# Patient Record
Sex: Female | Born: 1955
Health system: Southern US, Community
[De-identification: ages and names within clinical notes are randomized; demographics above are authoritative.]

## PROBLEM LIST (undated history)

## (undated) DIAGNOSIS — E079 Disorder of thyroid, unspecified: Secondary | ICD-10-CM

## (undated) HISTORY — PX: NASAL SINUS SURGERY: SHX719

## (undated) HISTORY — PX: BACK SURGERY: SHX140

---

## 1998-07-02 ENCOUNTER — Ambulatory Visit (HOSPITAL_BASED_OUTPATIENT_CLINIC_OR_DEPARTMENT_OTHER): Admission: RE | Admit: 1998-07-02 | Discharge: 1998-07-02 | Payer: Self-pay | Admitting: *Deleted

## 2018-06-09 DIAGNOSIS — L570 Actinic keratosis: Secondary | ICD-10-CM | POA: Diagnosis not present

## 2018-07-23 DIAGNOSIS — J0140 Acute pansinusitis, unspecified: Secondary | ICD-10-CM | POA: Diagnosis not present

## 2018-07-24 DIAGNOSIS — Z03818 Encounter for observation for suspected exposure to other biological agents ruled out: Secondary | ICD-10-CM | POA: Diagnosis not present

## 2018-07-24 DIAGNOSIS — Z1159 Encounter for screening for other viral diseases: Secondary | ICD-10-CM | POA: Diagnosis not present

## 2018-07-25 ENCOUNTER — Encounter (HOSPITAL_BASED_OUTPATIENT_CLINIC_OR_DEPARTMENT_OTHER): Payer: Self-pay | Admitting: *Deleted

## 2018-07-25 ENCOUNTER — Emergency Department (HOSPITAL_BASED_OUTPATIENT_CLINIC_OR_DEPARTMENT_OTHER)
Admission: EM | Admit: 2018-07-25 | Discharge: 2018-07-26 | Disposition: A | Discharge status: Home / Self Care | Payer: BC Managed Care – PPO | Attending: Emergency Medicine | Admitting: Emergency Medicine

## 2018-07-25 ENCOUNTER — Other Ambulatory Visit: Payer: Self-pay

## 2018-07-25 DIAGNOSIS — M549 Dorsalgia, unspecified: Secondary | ICD-10-CM | POA: Insufficient documentation

## 2018-07-25 DIAGNOSIS — K449 Diaphragmatic hernia without obstruction or gangrene: Secondary | ICD-10-CM | POA: Diagnosis not present

## 2018-07-25 DIAGNOSIS — R079 Chest pain, unspecified: Secondary | ICD-10-CM | POA: Diagnosis not present

## 2018-07-25 HISTORY — DX: Disorder of thyroid, unspecified: E07.9

## 2018-07-25 NOTE — ED Triage Notes (Addendum)
Pt reports left side posterior back/rib pain after starting amoxicillin for a sinus infection on Friday and states the front of her neck has been painful. She was tested for covid yesterday as a precaution but has not received her results. She states she came in tonight because she felt so bad

## 2018-07-26 ENCOUNTER — Emergency Department (HOSPITAL_BASED_OUTPATIENT_CLINIC_OR_DEPARTMENT_OTHER): Payer: BC Managed Care – PPO

## 2018-07-26 ENCOUNTER — Encounter: Payer: Self-pay | Admitting: Emergency Medicine

## 2018-07-26 DIAGNOSIS — K219 Gastro-esophageal reflux disease without esophagitis: Secondary | ICD-10-CM | POA: Diagnosis not present

## 2018-07-26 DIAGNOSIS — R079 Chest pain, unspecified: Secondary | ICD-10-CM | POA: Diagnosis not present

## 2018-07-26 DIAGNOSIS — Z09 Encounter for follow-up examination after completed treatment for conditions other than malignant neoplasm: Secondary | ICD-10-CM | POA: Diagnosis not present

## 2018-07-26 DIAGNOSIS — K449 Diaphragmatic hernia without obstruction or gangrene: Secondary | ICD-10-CM | POA: Diagnosis not present

## 2018-07-26 LAB — CBC WITH DIFFERENTIAL/PLATELET
Abs Immature Granulocytes: 0.04 10*3/uL (ref 0.00–0.07)
Basophils Absolute: 0.1 10*3/uL (ref 0.0–0.1)
Basophils Relative: 1 %
Eosinophils Absolute: 0.5 10*3/uL (ref 0.0–0.5)
Eosinophils Relative: 4 %
HCT: 40.8 % (ref 36.0–46.0)
Hemoglobin: 13.3 g/dL (ref 12.0–15.0)
Immature Granulocytes: 0 %
Lymphocytes Relative: 22 %
Lymphs Abs: 2.7 10*3/uL (ref 0.7–4.0)
MCH: 29.6 pg (ref 26.0–34.0)
MCHC: 32.6 g/dL (ref 30.0–36.0)
MCV: 90.7 fL (ref 80.0–100.0)
Monocytes Absolute: 1.1 10*3/uL — ABNORMAL HIGH (ref 0.1–1.0)
Monocytes Relative: 9 %
Neutro Abs: 7.6 10*3/uL (ref 1.7–7.7)
Neutrophils Relative %: 64 %
Platelets: 277 10*3/uL (ref 150–400)
RBC: 4.5 MIL/uL (ref 3.87–5.11)
RDW: 13.3 % (ref 11.5–15.5)
WBC: 12 10*3/uL — ABNORMAL HIGH (ref 4.0–10.5)
nRBC: 0 % (ref 0.0–0.2)

## 2018-07-26 LAB — TROPONIN I (HIGH SENSITIVITY)
Troponin I (High Sensitivity): <2 ng/L (ref <18)
Troponin I (High Sensitivity): <2 ng/L (ref <18)

## 2018-07-26 LAB — COMPREHENSIVE METABOLIC PANEL
ALT: 26 U/L (ref 0–44)
AST: 23 U/L (ref 15–41)
Albumin: 3.7 g/dL (ref 3.5–5.0)
Alkaline Phosphatase: 68 U/L (ref 38–126)
Anion gap: 9 (ref 5–15)
BUN: 13 mg/dL (ref 8–23)
CO2: 25 mmol/L (ref 22–32)
Calcium: 8.9 mg/dL (ref 8.9–10.3)
Chloride: 105 mmol/L (ref 98–111)
Creatinine, Ser: 0.7 mg/dL (ref 0.44–1.00)
GFR calc Af Amer: >60 mL/min (ref >60)
GFR calc non Af Amer: >60 mL/min (ref >60)
Glucose, Bld: 108 mg/dL — ABNORMAL HIGH (ref 70–99)
Potassium: 3.7 mmol/L (ref 3.5–5.1)
Sodium: 139 mmol/L (ref 135–145)
Total Bilirubin: 0.5 mg/dL (ref 0.3–1.2)
Total Protein: 6.1 g/dL — ABNORMAL LOW (ref 6.5–8.1)

## 2018-07-26 LAB — LIPASE, BLOOD: Lipase: 40 U/L (ref 11–51)

## 2018-07-26 MED ORDER — ALUM & MAG HYDROXIDE-SIMETH 200-200-20 MG/5ML PO SUSP
30.0000 mL | Freq: Once | ORAL | Status: AC
  Administered 2018-07-26: 02:00:00 30 mL via ORAL
  Filled 2018-07-26: qty 30

## 2018-07-26 MED ORDER — IOHEXOL 350 MG/ML SOLN
100.0000 mL | Freq: Once | INTRAVENOUS | Status: AC | PRN
  Administered 2018-07-26: 100 mL via INTRAVENOUS

## 2018-07-26 MED ORDER — LIDOCAINE VISCOUS HCL 2 % MT SOLN
15.0000 mL | Freq: Once | OROMUCOSAL | Status: AC
  Administered 2018-07-26: 15 mL via ORAL
  Filled 2018-07-26: qty 15

## 2018-07-26 MED ORDER — SODIUM CHLORIDE 0.9 % IV BOLUS
500.0000 mL | Freq: Once | INTRAVENOUS | Status: AC
  Administered 2018-07-26: 03:00:00 500 mL via INTRAVENOUS

## 2018-07-26 MED ORDER — ASPIRIN 81 MG PO CHEW
324.0000 mg | CHEWABLE_TABLET | Freq: Once | ORAL | Status: AC
  Administered 2018-07-26: 324 mg via ORAL
  Filled 2018-07-26: qty 4

## 2018-07-26 MED ORDER — PANTOPRAZOLE SODIUM 20 MG PO TBEC: 20.0000 mg | DELAYED_RELEASE_TABLET | Freq: Two times a day (BID) | ORAL | 3 refills | Status: AC

## 2018-07-26 MED ORDER — NITROGLYCERIN 0.4 MG SL SUBL
0.4000 mg | SUBLINGUAL_TABLET | SUBLINGUAL | Status: DC | PRN
  Administered 2018-07-26 (×3): 0.4 mg via SUBLINGUAL
  Filled 2018-07-26: qty 1

## 2018-07-26 NOTE — ED Provider Notes (Signed)
[facility] EMERGENCY DEPARTMENT Provider Note   CSN: [id number] Arrival date & time: 07/25/18  2348    History   Chief Complaint Chief Complaint  Patient presents with   Back Pain   Neck Pain    HPI Mary Orr is a 63 y.o. female.  HPI: A 63 year old patient presents for evaluation of chest pain. Initial onset of pain was approximately 3-6 hours ago. The patient's chest pain is described as heaviness/pressure/tightness and is not worse with exertion. The patient's chest pain is middle- or left-sided, is not well-localized, is not sharp and does radiate to the arms/jaw/neck. The patient does not complain of nausea and denies diaphoresis. The patient has no history of stroke, has no history of peripheral artery disease, has not smoked in the past 90 days, denies any history of treated diabetes, has no relevant family history of coronary artery disease (first degree relative at less than age 61), is not hypertensive, has no history of hypercholesterolemia and does not have an elevated BMI (>=30).   Patient presents to the emergency department for evaluation of chest and back discomfort.  Patient reports that she has not been feeling well for several days.  She talked to her primary doctor 2 days ago when she noticed that she was having some sinus congestion and was started on amoxicillin.  This evening she started to feel discomfort in the center of her back and feeling like she might be experiencing indigestion.  She took an antacid without improvement.  Over the last couple of hours, symptoms have worsened.  She is experiencing pain in the center of her chest that radiates up into the jaw.     Past Medical History:  Diagnosis Date   Thyroid disease     There are no active problems to display for this patient.   Past Surgical History:  Procedure Laterality Date   BACK SURGERY     NASAL SINUS SURGERY       OB History   No obstetric history on file.       Home Medications    Prior to Admission medications   Medication Sig Start Date End Date Taking? Authorizing Provider  amoxicillin-clavulanate (AUGMENTIN) 875-125 MG tablet Take 1 tablet by mouth 2 (two) times daily.   Yes [provider]  levothyroxine (SYNTHROID) 75 MCG tablet Take 75 mcg by mouth daily before breakfast.   Yes [provider]  pantoprazole (PROTONIX) 20 MG tablet Take 1 tablet (20 mg total) by mouth 2 (two) times daily before a meal. 07/26/18   Pier Bosher, Canary Brimhristopher J, MD    Family History No family history on file.  Social History Social History   Tobacco Use   Smoking status: Never Smoker   Smokeless tobacco: Never Used  Substance Use Topics   Alcohol use: Never    Frequency: Never   Drug use: Never     Allergies   Patient has no known allergies.   Review of Systems Review of Systems  Cardiovascular: Positive for chest pain.  Gastrointestinal: Positive for abdominal distention (Feels bloated) and blood in stool.  Musculoskeletal: Positive for back pain.  All other systems reviewed and are negative.    Physical Exam Updated Vital Signs BP 118/74    Pulse 83    Temp 98.4 F (36.9 C)    Resp 10    Ht 5\' 3"  (1.6 m)    Wt 77.1 kg    SpO2 95%    BMI 30.11 kg/m  Physical Exam Vitals signs and nursing note reviewed.  Constitutional:      General: She is not in acute distress.    Appearance: Normal appearance. She is well-developed.  HENT:     Head: Normocephalic and atraumatic.     Right Ear: Hearing normal.     Left Ear: Hearing normal.     Nose: Nose normal.  Eyes:     Conjunctiva/sclera: Conjunctivae normal.     Pupils: Pupils are equal, round, and reactive to light.  Neck:     Musculoskeletal: Normal range of motion and neck supple.  Cardiovascular:     Rate and Rhythm: Regular rhythm.     Heart sounds: S1 normal and S2 normal. No murmur. No friction rub. No gallop.   Pulmonary:     Effort: Pulmonary effort is  normal. No respiratory distress.     Breath sounds: Normal breath sounds.  Chest:     Chest wall: No tenderness.  Abdominal:     General: Bowel sounds are normal.     Palpations: Abdomen is soft.     Tenderness: There is no abdominal tenderness. There is no guarding or rebound. Negative signs include Murphy's sign and McBurney's sign.     Hernia: No hernia is present.  Musculoskeletal: Normal range of motion.  Skin:    General: Skin is warm and dry.     Findings: No rash.  Neurological:     Mental Status: She is alert and oriented to person, place, and time.     GCS: GCS eye subscore is 4. GCS verbal subscore is 5. GCS motor subscore is 6.     Cranial Nerves: No cranial nerve deficit.     Sensory: No sensory deficit.     Coordination: Coordination normal.  Psychiatric:        Speech: Speech normal.        Behavior: Behavior normal.        Thought Content: Thought content normal.      ED Treatments / Results  Labs (all labs ordered are listed, but only abnormal results are displayed) Labs Reviewed  CBC WITH DIFFERENTIAL/PLATELET - Abnormal; Notable for the following components:      Result Value   WBC 12.0 (*)    Monocytes Absolute 1.1 (*)    All other components within normal limits  COMPREHENSIVE METABOLIC PANEL - Abnormal; Notable for the following components:   Glucose, Bld 108 (*)    Total Protein 6.1 (*)    All other components within normal limits  LIPASE, BLOOD  TROPONIN I (HIGH SENSITIVITY)  TROPONIN I (HIGH SENSITIVITY)    EKG EKG Interpretation  Date/Time:  Monday July 26 2018 00:36:46 EDT Ventricular Rate:  70 PR Interval:    QRS Duration: 85 QT Interval:  404 QTC Calculation: 436 R Axis:   23 Text Interpretation:  Sinus rhythm Low voltage, precordial leads Abnormal R-wave progression, early transition Baseline wander in lead(s) V6 No previous tracing Confirmed by Orpah Greek [phone]) on 07/26/2018 12:39:28 AM   Radiology Dg Chest 2  View  Result Date: 07/26/2018 CLINICAL DATA:  Neck pain, left chest pain. EXAM: CHEST - 2 VIEW COMPARISON:  None. FINDINGS: Heart and mediastinal contours are within normal limits. No focal opacities or effusions. No acute bony abnormality. IMPRESSION: No active cardiopulmonary disease. Electronically Signed   By: Rolm Baptise M.D.   On: 07/26/2018 01:34   Ct Angio Chest/abd/pel For Dissection W &/or Wo Contrast  Result Date: 07/26/2018 CLINICAL DATA:  Chest and back pain EXAM: CT ANGIOGRAPHY CHEST, ABDOMEN AND PELVIS TECHNIQUE: Multidetector CT imaging through the chest, abdomen and pelvis was performed using the standard protocol during bolus administration of intravenous contrast. Multiplanar reconstructed images and MIPs were obtained and reviewed to evaluate the vascular anatomy. CONTRAST:  100mL OMNIPAQUE IOHEXOL 350 MG/ML SOLN COMPARISON:  07/26/2018 chest x-ray FINDINGS: CTA CHEST FINDINGS Cardiovascular: Heart is normal size. Aorta is normal caliber. No evidence of aortic dissection. No filling defects in the pulmonary arteries to suggest pulmonary emboli. Mediastinum/Nodes: No mediastinal, hilar, or axillary adenopathy. Trachea and esophagus are unremarkable. Small hiatal hernia. Thyroid unremarkable. Lungs/Pleura: Lungs are clear. No focal airspace opacities or suspicious nodules. No effusions. Musculoskeletal: No acute bony abnormality. Review of the MIP images confirms the above findings. CTA ABDOMEN AND PELVIS FINDINGS VASCULAR Aorta: Normal caliber aorta without aneurysm, dissection, vasculitis or significant stenosis. Celiac: Patent without evidence of aneurysm, dissection, vasculitis or significant stenosis. SMA: Patent without evidence of aneurysm, dissection, vasculitis or significant stenosis. Renals: Single right renal artery and 2 left renal arteries. No aneurysm. Widely patent. IMA: Patent without evidence of aneurysm, dissection, vasculitis or significant stenosis. Inflow: Patent  without evidence of aneurysm, dissection, vasculitis or significant stenosis. Veins: No obvious venous abnormality within the limitations of this arterial phase study. Review of the MIP images confirms the above findings. NON-VASCULAR Hepatobiliary: No focal liver abnormality is seen. Status post cholecystectomy. No biliary dilatation. Pancreas: No focal abnormality or ductal dilatation. Spleen: No focal abnormality.  Normal size. Adrenals/Urinary Tract: No adrenal abnormality. No focal renal abnormality. No stones or hydronephrosis. Urinary bladder is unremarkable. Stomach/Bowel: Normal appendix. Stomach, large and small bowel grossly unremarkable. Lymphatic: No adenopathy Reproductive: Uterus and adnexa unremarkable.  No mass. Other: No free fluid or free air. Musculoskeletal: No acute bony abnormality. Review of the MIP images confirms the above findings. IMPRESSION: No evidence of aortic aneurysm or dissection. No acute cardiopulmonary disease. Small hiatal hernia. No acute finding in the abdomen or pelvis. Electronically Signed   By: Charlett NoseKevin  Dover M.D.   On: 07/26/2018 03:13    Procedures Procedures (including critical care time)  Medications Ordered in ED Medications  nitroGLYCERIN (NITROSTAT) SL tablet 0.4 mg (0.4 mg Sublingual Given 07/26/18 0106)  aspirin chewable tablet 324 mg (324 mg Oral Given 07/26/18 0050)  alum & mag hydroxide-simeth (MAALOX/MYLANTA) 200-200-20 MG/5ML suspension 30 mL (30 mLs Oral Given 07/26/18 0205)    And  lidocaine (XYLOCAINE) 2 % viscous mouth solution 15 mL (15 mLs Oral Given 07/26/18 0205)  sodium chloride 0.9 % bolus 500 mL (500 mLs Intravenous New Bag/Given 07/26/18 0255)  iohexol (OMNIPAQUE) 350 MG/ML injection 100 mL (100 mLs Intravenous Contrast Given 07/26/18 0244)     Initial Impression / Assessment and Plan / ED Course  I have reviewed the triage vital signs and the nursing notes.  Pertinent labs & imaging results that were available during my care of the  patient were reviewed by me and considered in my medical decision making (see chart for details).     HEAR Score: 2  Patient presents to the emergency department for evaluation of chest and back discomfort.  Patient reports that she is experiencing mostly pain between her shoulder blades but that she has a sensation of a lump in her throat as well.  She reports that this feels more pronounced when she breathes in.  She has not feeling short of breath.  She had some discomfort in the upper chest intermittently today as well.  Additionally, patient has felt bloated and has some mild discomfort in her abdomen.  She had some loose stools earlier and there was some blood in the last time she had a bowel movement.  Patient has very low cardiac risk.  Heart score is 2.  Based on the cardiac pathway, she is felt to be low risk for cardiac disease and discharge at this time could be considered.  Based on her radiation pattern up to the throat, however, I have opted to perform a second troponin.  This second troponin was also negative (<2).  Additionally with her symptoms including chest, back and abdominal complaints, she underwent CT angiography to rule out aortic dissection.  No abnormalities were noted.  Based on this extensive work-up, it is felt the patient can be safely discharged, follow-up with her PCP for recheck.  Return for any worsening symptoms.  Final Clinical Impressions(s) / ED Diagnoses   Final diagnoses:  Chest pain, unspecified type    ED Discharge Orders         Ordered    pantoprazole (PROTONIX) 20 MG tablet  2 times daily before meals     07/26/18 0334           Gilda CreasePollina, Houa Ackert J, MD 07/26/18 [phone]

## 2018-07-26 NOTE — ED Notes (Signed)
Pt reports feeling intermittent chest/back/jaw pain- pt did not take dose of Augmentin today in hopes her symptoms would improve. Pt dx with sinus infection via televisit with pcp

## 2018-07-26 NOTE — ED Notes (Signed)
ED Provider at bedside. 

## 2018-07-26 NOTE — ED Notes (Signed)
Updated patients family regarding plan of care at this time

## 2018-07-26 NOTE — ED Notes (Signed)
MD at bedside, discussing plan of care with patient at this time.

## 2018-07-26 NOTE — ED Notes (Signed)
Pt to CT at this time.

## 2018-09-10 ENCOUNTER — Emergency Department (HOSPITAL_BASED_OUTPATIENT_CLINIC_OR_DEPARTMENT_OTHER): Payer: BC Managed Care – PPO

## 2018-09-10 ENCOUNTER — Encounter (HOSPITAL_BASED_OUTPATIENT_CLINIC_OR_DEPARTMENT_OTHER): Payer: Self-pay

## 2018-09-10 ENCOUNTER — Other Ambulatory Visit: Payer: Self-pay

## 2018-09-10 ENCOUNTER — Emergency Department (HOSPITAL_BASED_OUTPATIENT_CLINIC_OR_DEPARTMENT_OTHER)
Admission: EM | Admit: 2018-09-10 | Discharge: 2018-09-10 | Disposition: A | Discharge status: Home / Self Care | Payer: BC Managed Care – PPO | Attending: Emergency Medicine | Admitting: Emergency Medicine

## 2018-09-10 DIAGNOSIS — R0981 Nasal congestion: Secondary | ICD-10-CM | POA: Diagnosis not present

## 2018-09-10 DIAGNOSIS — R6889 Other general symptoms and signs: Secondary | ICD-10-CM

## 2018-09-10 DIAGNOSIS — R070 Pain in throat: Secondary | ICD-10-CM | POA: Insufficient documentation

## 2018-09-10 DIAGNOSIS — R0989 Other specified symptoms and signs involving the circulatory and respiratory systems: Secondary | ICD-10-CM | POA: Diagnosis not present

## 2018-09-10 DIAGNOSIS — Z20828 Contact with and (suspected) exposure to other viral communicable diseases: Secondary | ICD-10-CM | POA: Diagnosis not present

## 2018-09-10 DIAGNOSIS — J029 Acute pharyngitis, unspecified: Secondary | ICD-10-CM | POA: Diagnosis not present

## 2018-09-10 DIAGNOSIS — E039 Hypothyroidism, unspecified: Secondary | ICD-10-CM | POA: Diagnosis not present

## 2018-09-10 MED ORDER — EPINEPHRINE 0.3 MG/0.3ML IJ SOAJ: 0.3000 mg | INTRAMUSCULAR | 0 refills | Status: AC | PRN

## 2018-09-10 MED ORDER — FAMOTIDINE 20 MG PO TABS
20.0000 mg | ORAL_TABLET | Freq: Once | ORAL | Status: AC
  Administered 2018-09-10: 20 mg via ORAL
  Filled 2018-09-10: qty 1

## 2018-09-10 MED ORDER — PREDNISONE 50 MG PO TABS
60.0000 mg | ORAL_TABLET | Freq: Once | ORAL | Status: AC
  Administered 2018-09-10: 60 mg via ORAL
  Filled 2018-09-10: qty 1

## 2018-09-10 MED ORDER — PREDNISONE 20 MG PO TABS: 40.0000 mg | ORAL_TABLET | Freq: Every day | ORAL | 0 refills | Status: AC

## 2018-09-10 MED FILL — EPINEPHRINE 0.3 MG AUTO-INJ: 0.3 | 2 days supply | Qty: 2 | Fill #0

## 2018-09-10 MED FILL — predniSONE 20 MG TABS: 20 | 4 days supply | Qty: 8 | Fill #0

## 2018-09-10 NOTE — ED Provider Notes (Signed)
Emergency Department Provider Note   I have reviewed the triage vital signs and the nursing notes.   HISTORY  Chief Complaint Allergic Reaction   HPI Mary Orr is a 63 y.o. female presents to the emergency department for evaluation of "thick" feeling in the tongue and back of her throat.  Symptoms began yesterday and worsened through the night.  Patient has no trouble breathing in but occasionally of breathing out feels somewhat uncomfortable.  She also has difficulty swallowing.  Denies any choking or coughing.  She felt initially like she may have been getting an upper respiratory type infection.  She denies fevers.  She has had some mild nasal congestion.  She had a similar episode 1 to 2 weeks ago which she thought was related to exposure to nuts.  She is not on blood pressure medication.  During that episode she did have some upper and lower lip swelling that resolved spontaneously.  No prior history of anaphylaxis.  Denies any itching or rash.  No abdominal or GI symptoms.  No chest pain.   Past Medical History:  Diagnosis Date  . Thyroid disease     There are no active problems to display for this patient.   Past Surgical History:  Procedure Laterality Date  . BACK SURGERY    . NASAL SINUS SURGERY      Allergies Patient has no known allergies.  History reviewed. No pertinent family history.  Social History Social History   Tobacco Use  . Smoking status: Never Smoker  . Smokeless tobacco: Never Used  Substance Use Topics  . Alcohol use: Never    Frequency: Never  . Drug use: Never    Review of Systems  Constitutional: No fever/chills Eyes: No visual changes. ENT: No sore throat. Positive thick feeling in the throat.  Cardiovascular: Denies chest pain. Respiratory: Denies shortness of breath. Gastrointestinal: No abdominal pain.  No nausea, no vomiting.  No diarrhea.  No constipation. Genitourinary: Negative for dysuria. Musculoskeletal: Negative  for back pain. Skin: Negative for rash. Neurological: Negative for headaches, focal weakness or numbness.  10-point ROS otherwise negative.  ____________________________________________   PHYSICAL EXAM:  VITAL SIGNS: ED Triage Vitals  Enc Vitals Group     BP 09/10/18 0919 124/77     Pulse Rate 09/10/18 0919 78     Resp 09/10/18 0919 16     Temp 09/10/18 0919 98.3 F (36.8 C)     Temp Source 09/10/18 0919 Oral     SpO2 09/10/18 0919 98 %     Weight 09/10/18 0916 165 lb (74.8 kg)     Height 09/10/18 0916 5\' 3"  (1.6 m)   Constitutional: Alert and oriented. Well appearing and in no acute distress. Eyes: Conjunctivae are normal.  Head: Atraumatic. Nose: No congestion/rhinnorhea. Mouth/Throat: Mucous membranes are moist.  Oropharynx non-erythematous.  No exudate.  No peritonsillar abscess.  Posterior pharynx is patent.  No area of focal or diffuse edema appreciated.  Tongue is normal size.  Submandibular compartment is soft.  Neck: No stridor. Mild bilateral adenopathy noted.  Cardiovascular: Normal rate, regular rhythm. Good peripheral circulation. Grossly normal heart sounds.   Respiratory: Normal respiratory effort.  No retractions. Lungs CTAB. Gastrointestinal: Soft and nontender. No distention.  Musculoskeletal: No lower extremity tenderness nor edema. No gross deformities of extremities. Neurologic:  Normal speech and language. No gross focal neurologic deficits are appreciated.  Skin:  Skin is warm, dry and intact. No rash noted.  ____________________________________________   LABS (all  labs ordered are listed, but only abnormal results are displayed)  Labs Reviewed  NOVEL CORONAVIRUS, NAA (HOSPITAL ORDER, SEND-OUT TO REF LAB)   ____________________________________________  RADIOLOGY  Dg Neck Soft Tissue  Result Date: 09/10/2018 CLINICAL DATA:  Sore throat. EXAM: NECK SOFT TISSUES - 1+ VIEW COMPARISON:  None. FINDINGS: There is no evidence of retropharyngeal soft  tissue swelling or epiglottic enlargement. The cervical airway is unremarkable and no radio-opaque foreign body identified. IMPRESSION: Negative. Electronically Signed   By: Gerome Samavid  Williams III M.D   On: 09/10/2018 10:01    ____________________________________________   PROCEDURES  Procedure(s) performed:   Procedures  None ____________________________________________   INITIAL IMPRESSION / ASSESSMENT AND PLAN / ED COURSE  Pertinent labs & imaging results that were available during my care of the patient were reviewed by me and considered in my medical decision making (see chart for details).   Patient presents to the emergency department with thick feeling in the throat which is been present through the evening.  She has no stridor.  She speaking clearly.  I do not appreciate any area of focal or more diffuse swelling on my exam.  Afebrile.  COVID-19 is a consideration but lower on my differential.   Plain film reviewed. No posterior airway narrowing appreciated. Patient continues to look clinically well and feeling slightly improved in the ED. No evidence of angioedema, anaphylaxis, or deep space neck infection. Plan for steroids and PCP follow up for allergy referral should symptoms return/continue. Discussed EpiPen use as well. At discharge, patient verbalizes to nurse that she would like to defer COVID testing for now.  ____________________________________________  FINAL CLINICAL IMPRESSION(S) / ED DIAGNOSES  Final diagnoses:  Throat tightness  Nasal congestion     MEDICATIONS GIVEN DURING THIS VISIT:  Medications  predniSONE (DELTASONE) tablet 60 mg (60 mg Oral Given 09/10/18 0936)  famotidine (PEPCID) tablet 20 mg (20 mg Oral Given 09/10/18 0936)     NEW OUTPATIENT MEDICATIONS STARTED DURING THIS VISIT:  Discharge Medication List as of 09/10/2018 10:31 AM    START taking these medications   Details  EPINEPHrine 0.3 mg/0.3 mL IJ SOAJ injection Inject 0.3 mLs (0.3 mg  total) into the muscle as needed for anaphylaxis., Starting Fri 09/10/2018, Normal    predniSONE (DELTASONE) 20 MG tablet Take 2 tablets (40 mg total) by mouth daily for 4 days., Starting Sat 09/11/2018, Until Wed 09/15/2018, Normal        Note:  This document was prepared using Dragon voice recognition software and may include unintentional dictation errors.  Alona BeneJoshua Isaih Bulger, MD Emergency Medicine    Garry Bochicchio, Arlyss RepressJoshua G, MD 09/10/18 [phone]

## 2018-09-10 NOTE — Discharge Instructions (Signed)
You were seen in the emergency department today with sensation of throat tightness with some nasal congestion.  I am treating you with steroid over the next 4 days.  You had today's dose already.  Please refill the EpiPen and use if you have sudden worsening/severe symptoms such as inability to swallow or severe shortness of breath.  Call 911 if you have to use this.  Follow-up with your primary care physician.  As a precaution, I am testing you for COVID-19.  Please remain isolated at home until your test results come back and you are feeling better.  You can check the results in the MyChart app on your phone.  Information regarding how to set this up is included in this discharge paperwork.

## 2018-09-10 NOTE — ED Triage Notes (Addendum)
Pt states feels as if shes having a reaction to nuts.  Does not have prior hx of reaction to nuts, but states she has been having a lot lately, and feels it may have happened in the past but she didn't take notice of it at the time.  States difficulty talking and tongue thick.  Took Benadryl 50mg  7am.  She otherwise is wondering if she is developing a cold. Denies fever, has been checking temp all night

## 2018-09-10 NOTE — ED Notes (Signed)
Pt declines covid testing at this time due to low suspicion of symptoms.  Will continue to monitor for any symptoms.  Dr Laverta Baltimore approved of this plan for discharge.

## 2018-09-10 NOTE — ED Notes (Signed)
MD at bedside. 

## 2018-09-27 DIAGNOSIS — Z91018 Allergy to other foods: Secondary | ICD-10-CM | POA: Diagnosis not present

## 2018-09-27 DIAGNOSIS — H1045 Other chronic allergic conjunctivitis: Secondary | ICD-10-CM | POA: Diagnosis not present

## 2018-09-27 DIAGNOSIS — R05 Cough: Secondary | ICD-10-CM | POA: Diagnosis not present

## 2018-09-27 DIAGNOSIS — J3089 Other allergic rhinitis: Secondary | ICD-10-CM | POA: Diagnosis not present

## 2018-09-27 DIAGNOSIS — R1310 Dysphagia, unspecified: Secondary | ICD-10-CM | POA: Diagnosis not present

## 2018-10-20 DIAGNOSIS — L570 Actinic keratosis: Secondary | ICD-10-CM | POA: Diagnosis not present

## 2018-10-20 DIAGNOSIS — L814 Other melanin hyperpigmentation: Secondary | ICD-10-CM | POA: Diagnosis not present

## 2018-10-25 DIAGNOSIS — Z03818 Encounter for observation for suspected exposure to other biological agents ruled out: Secondary | ICD-10-CM | POA: Diagnosis not present

## 2018-10-25 DIAGNOSIS — R0602 Shortness of breath: Secondary | ICD-10-CM | POA: Diagnosis not present

## 2018-10-25 DIAGNOSIS — R509 Fever, unspecified: Secondary | ICD-10-CM | POA: Diagnosis not present

## 2018-10-28 DIAGNOSIS — R509 Fever, unspecified: Secondary | ICD-10-CM | POA: Diagnosis not present

## 2018-10-28 DIAGNOSIS — E079 Disorder of thyroid, unspecified: Secondary | ICD-10-CM | POA: Diagnosis not present

## 2018-10-28 DIAGNOSIS — Z7951 Long term (current) use of inhaled steroids: Secondary | ICD-10-CM | POA: Diagnosis not present

## 2018-10-28 DIAGNOSIS — R0602 Shortness of breath: Secondary | ICD-10-CM | POA: Diagnosis not present

## 2018-10-28 DIAGNOSIS — Z86718 Personal history of other venous thrombosis and embolism: Secondary | ICD-10-CM | POA: Diagnosis not present

## 2018-10-28 DIAGNOSIS — R5383 Other fatigue: Secondary | ICD-10-CM | POA: Diagnosis not present

## 2018-10-28 DIAGNOSIS — R05 Cough: Secondary | ICD-10-CM | POA: Diagnosis not present

## 2018-10-28 DIAGNOSIS — U071 COVID-19: Secondary | ICD-10-CM | POA: Diagnosis not present

## 2018-10-28 DIAGNOSIS — R918 Other nonspecific abnormal finding of lung field: Secondary | ICD-10-CM | POA: Diagnosis not present

## 2018-10-29 DIAGNOSIS — U071 COVID-19: Secondary | ICD-10-CM | POA: Diagnosis not present

## 2018-10-29 DIAGNOSIS — Z79899 Other long term (current) drug therapy: Secondary | ICD-10-CM | POA: Diagnosis not present

## 2018-10-29 DIAGNOSIS — J9601 Acute respiratory failure with hypoxia: Secondary | ICD-10-CM | POA: Diagnosis not present

## 2018-10-29 DIAGNOSIS — F4323 Adjustment disorder with mixed anxiety and depressed mood: Secondary | ICD-10-CM | POA: Diagnosis not present

## 2018-10-29 DIAGNOSIS — E038 Other specified hypothyroidism: Secondary | ICD-10-CM | POA: Diagnosis not present

## 2018-10-29 DIAGNOSIS — J1289 Other viral pneumonia: Secondary | ICD-10-CM | POA: Diagnosis not present

## 2018-10-29 DIAGNOSIS — R0602 Shortness of breath: Secondary | ICD-10-CM | POA: Diagnosis not present

## 2018-10-29 DIAGNOSIS — R918 Other nonspecific abnormal finding of lung field: Secondary | ICD-10-CM | POA: Diagnosis not present

## 2018-10-29 DIAGNOSIS — E039 Hypothyroidism, unspecified: Secondary | ICD-10-CM | POA: Diagnosis not present

## 2018-11-18 DIAGNOSIS — R300 Dysuria: Secondary | ICD-10-CM | POA: Diagnosis not present

## 2018-11-22 DIAGNOSIS — U071 COVID-19: Secondary | ICD-10-CM | POA: Diagnosis not present

## 2018-11-22 DIAGNOSIS — E039 Hypothyroidism, unspecified: Secondary | ICD-10-CM | POA: Diagnosis not present

## 2018-11-22 DIAGNOSIS — J189 Pneumonia, unspecified organism: Secondary | ICD-10-CM | POA: Diagnosis not present

## 2018-11-22 DIAGNOSIS — R300 Dysuria: Secondary | ICD-10-CM | POA: Diagnosis not present

## 2018-11-30 DIAGNOSIS — R300 Dysuria: Secondary | ICD-10-CM | POA: Diagnosis not present

## 2018-11-30 DIAGNOSIS — M6289 Other specified disorders of muscle: Secondary | ICD-10-CM | POA: Diagnosis not present

## 2018-12-01 DIAGNOSIS — J209 Acute bronchitis, unspecified: Secondary | ICD-10-CM | POA: Diagnosis not present

## 2018-12-07 DIAGNOSIS — R7989 Other specified abnormal findings of blood chemistry: Secondary | ICD-10-CM | POA: Diagnosis not present

## 2018-12-07 DIAGNOSIS — Z0184 Encounter for antibody response examination: Secondary | ICD-10-CM | POA: Diagnosis not present

## 2018-12-07 DIAGNOSIS — J40 Bronchitis, not specified as acute or chronic: Secondary | ICD-10-CM | POA: Diagnosis not present

## 2018-12-20 DIAGNOSIS — R002 Palpitations: Secondary | ICD-10-CM | POA: Diagnosis not present

## 2018-12-20 DIAGNOSIS — J40 Bronchitis, not specified as acute or chronic: Secondary | ICD-10-CM | POA: Diagnosis not present

## 2018-12-20 DIAGNOSIS — R7989 Other specified abnormal findings of blood chemistry: Secondary | ICD-10-CM | POA: Diagnosis not present

## 2018-12-27 DIAGNOSIS — U071 COVID-19: Secondary | ICD-10-CM | POA: Diagnosis not present

## 2018-12-27 DIAGNOSIS — J1289 Other viral pneumonia: Secondary | ICD-10-CM | POA: Diagnosis not present

## 2018-12-28 DIAGNOSIS — Z8619 Personal history of other infectious and parasitic diseases: Secondary | ICD-10-CM | POA: Diagnosis not present

## 2018-12-28 DIAGNOSIS — I071 Rheumatic tricuspid insufficiency: Secondary | ICD-10-CM | POA: Diagnosis not present

## 2019-01-04 DIAGNOSIS — K581 Irritable bowel syndrome with constipation: Secondary | ICD-10-CM | POA: Diagnosis not present

## 2019-01-04 DIAGNOSIS — M6289 Other specified disorders of muscle: Secondary | ICD-10-CM | POA: Diagnosis not present

## 2019-01-04 DIAGNOSIS — R339 Retention of urine, unspecified: Secondary | ICD-10-CM | POA: Diagnosis not present

## 2019-01-04 DIAGNOSIS — R351 Nocturia: Secondary | ICD-10-CM | POA: Diagnosis not present

## 2019-02-03 DIAGNOSIS — R1084 Generalized abdominal pain: Secondary | ICD-10-CM | POA: Diagnosis not present

## 2019-02-03 DIAGNOSIS — R197 Diarrhea, unspecified: Secondary | ICD-10-CM | POA: Diagnosis not present

## 2019-02-03 DIAGNOSIS — K219 Gastro-esophageal reflux disease without esophagitis: Secondary | ICD-10-CM | POA: Diagnosis not present

## 2019-02-03 DIAGNOSIS — R109 Unspecified abdominal pain: Secondary | ICD-10-CM | POA: Diagnosis not present

## 2019-02-03 DIAGNOSIS — K921 Melena: Secondary | ICD-10-CM | POA: Diagnosis not present

## 2019-02-07 DIAGNOSIS — U071 COVID-19: Secondary | ICD-10-CM | POA: Diagnosis not present

## 2019-02-07 DIAGNOSIS — J1282 Pneumonia due to coronavirus disease 2019: Secondary | ICD-10-CM | POA: Diagnosis not present

## 2019-02-07 DIAGNOSIS — E039 Hypothyroidism, unspecified: Secondary | ICD-10-CM | POA: Diagnosis not present

## 2019-03-18 DIAGNOSIS — K222 Esophageal obstruction: Secondary | ICD-10-CM | POA: Diagnosis not present

## 2019-03-18 DIAGNOSIS — K2950 Unspecified chronic gastritis without bleeding: Secondary | ICD-10-CM | POA: Diagnosis not present

## 2019-03-18 DIAGNOSIS — R1310 Dysphagia, unspecified: Secondary | ICD-10-CM | POA: Diagnosis not present

## 2019-03-18 DIAGNOSIS — K3189 Other diseases of stomach and duodenum: Secondary | ICD-10-CM | POA: Diagnosis not present

## 2019-03-18 DIAGNOSIS — R197 Diarrhea, unspecified: Secondary | ICD-10-CM | POA: Diagnosis not present

## 2019-03-18 DIAGNOSIS — K2980 Duodenitis without bleeding: Secondary | ICD-10-CM | POA: Diagnosis not present

## 2019-03-18 DIAGNOSIS — K2100 Gastro-esophageal reflux disease with esophagitis, without bleeding: Secondary | ICD-10-CM | POA: Diagnosis not present

## 2019-03-18 DIAGNOSIS — K621 Rectal polyp: Secondary | ICD-10-CM | POA: Diagnosis not present

## 2019-03-31 DIAGNOSIS — M2550 Pain in unspecified joint: Secondary | ICD-10-CM | POA: Diagnosis not present

## 2019-03-31 DIAGNOSIS — R5383 Other fatigue: Secondary | ICD-10-CM | POA: Diagnosis not present

## 2019-03-31 DIAGNOSIS — Z8616 Personal history of COVID-19: Secondary | ICD-10-CM | POA: Diagnosis not present

## 2019-03-31 DIAGNOSIS — R05 Cough: Secondary | ICD-10-CM | POA: Diagnosis not present

## 2019-04-06 DIAGNOSIS — R1084 Generalized abdominal pain: Secondary | ICD-10-CM | POA: Diagnosis not present

## 2019-04-06 DIAGNOSIS — R1310 Dysphagia, unspecified: Secondary | ICD-10-CM | POA: Diagnosis not present

## 2019-04-06 DIAGNOSIS — R197 Diarrhea, unspecified: Secondary | ICD-10-CM | POA: Diagnosis not present

## 2019-04-06 DIAGNOSIS — K219 Gastro-esophageal reflux disease without esophagitis: Secondary | ICD-10-CM | POA: Diagnosis not present

## 2019-04-11 DIAGNOSIS — M8589 Other specified disorders of bone density and structure, multiple sites: Secondary | ICD-10-CM | POA: Diagnosis not present

## 2019-04-11 DIAGNOSIS — M85851 Other specified disorders of bone density and structure, right thigh: Secondary | ICD-10-CM | POA: Diagnosis not present

## 2019-04-11 DIAGNOSIS — Z1231 Encounter for screening mammogram for malignant neoplasm of breast: Secondary | ICD-10-CM | POA: Diagnosis not present

## 2019-04-25 DIAGNOSIS — L578 Other skin changes due to chronic exposure to nonionizing radiation: Secondary | ICD-10-CM | POA: Diagnosis not present

## 2019-04-25 DIAGNOSIS — L821 Other seborrheic keratosis: Secondary | ICD-10-CM | POA: Diagnosis not present

## 2019-04-25 DIAGNOSIS — L814 Other melanin hyperpigmentation: Secondary | ICD-10-CM | POA: Diagnosis not present

## 2019-04-25 DIAGNOSIS — D1801 Hemangioma of skin and subcutaneous tissue: Secondary | ICD-10-CM | POA: Diagnosis not present

## 2019-04-25 DIAGNOSIS — L570 Actinic keratosis: Secondary | ICD-10-CM | POA: Diagnosis not present

## 2019-04-27 DIAGNOSIS — R5382 Chronic fatigue, unspecified: Secondary | ICD-10-CM | POA: Diagnosis not present

## 2019-04-27 DIAGNOSIS — Z8616 Personal history of COVID-19: Secondary | ICD-10-CM | POA: Diagnosis not present

## 2019-04-27 DIAGNOSIS — R0683 Snoring: Secondary | ICD-10-CM | POA: Diagnosis not present

## 2019-04-27 DIAGNOSIS — R0602 Shortness of breath: Secondary | ICD-10-CM | POA: Diagnosis not present

## 2019-04-27 DIAGNOSIS — G4719 Other hypersomnia: Secondary | ICD-10-CM | POA: Diagnosis not present

## 2019-04-27 DIAGNOSIS — R5383 Other fatigue: Secondary | ICD-10-CM | POA: Diagnosis not present

## 2019-04-27 DIAGNOSIS — M2550 Pain in unspecified joint: Secondary | ICD-10-CM | POA: Diagnosis not present

## 2019-04-27 DIAGNOSIS — E039 Hypothyroidism, unspecified: Secondary | ICD-10-CM | POA: Diagnosis not present

## 2019-04-27 DIAGNOSIS — M3581 Multisystem inflammatory syndrome: Secondary | ICD-10-CM | POA: Diagnosis not present

## 2019-05-02 DIAGNOSIS — R5383 Other fatigue: Secondary | ICD-10-CM | POA: Diagnosis not present

## 2019-05-02 DIAGNOSIS — Z01419 Encounter for gynecological examination (general) (routine) without abnormal findings: Secondary | ICD-10-CM | POA: Diagnosis not present

## 2019-05-02 DIAGNOSIS — Z0000 Encounter for general adult medical examination without abnormal findings: Secondary | ICD-10-CM | POA: Diagnosis not present

## 2019-05-02 DIAGNOSIS — Z8616 Personal history of COVID-19: Secondary | ICD-10-CM | POA: Diagnosis not present

## 2019-05-03 DIAGNOSIS — Z23 Encounter for immunization: Secondary | ICD-10-CM | POA: Diagnosis not present

## 2019-05-10 DIAGNOSIS — Z13220 Encounter for screening for lipoid disorders: Secondary | ICD-10-CM | POA: Diagnosis not present

## 2019-05-17 DIAGNOSIS — R29898 Other symptoms and signs involving the musculoskeletal system: Secondary | ICD-10-CM | POA: Diagnosis not present

## 2019-05-17 DIAGNOSIS — M6281 Muscle weakness (generalized): Secondary | ICD-10-CM | POA: Diagnosis not present

## 2019-05-17 DIAGNOSIS — Z8616 Personal history of COVID-19: Secondary | ICD-10-CM | POA: Diagnosis not present

## 2019-05-17 DIAGNOSIS — R5383 Other fatigue: Secondary | ICD-10-CM | POA: Diagnosis not present

## 2019-05-24 DIAGNOSIS — Z23 Encounter for immunization: Secondary | ICD-10-CM | POA: Diagnosis not present

## 2019-06-09 DIAGNOSIS — Z8262 Family history of osteoporosis: Secondary | ICD-10-CM | POA: Diagnosis not present

## 2019-06-09 DIAGNOSIS — Z79899 Other long term (current) drug therapy: Secondary | ICD-10-CM | POA: Diagnosis not present

## 2019-06-09 DIAGNOSIS — E559 Vitamin D deficiency, unspecified: Secondary | ICD-10-CM | POA: Diagnosis not present

## 2019-06-09 DIAGNOSIS — M8580 Other specified disorders of bone density and structure, unspecified site: Secondary | ICD-10-CM | POA: Diagnosis not present

## 2019-06-20 DIAGNOSIS — Z8262 Family history of osteoporosis: Secondary | ICD-10-CM | POA: Diagnosis not present

## 2019-06-20 DIAGNOSIS — M8580 Other specified disorders of bone density and structure, unspecified site: Secondary | ICD-10-CM | POA: Diagnosis not present

## 2019-06-20 DIAGNOSIS — E559 Vitamin D deficiency, unspecified: Secondary | ICD-10-CM | POA: Diagnosis not present

## 2019-06-20 DIAGNOSIS — Z79899 Other long term (current) drug therapy: Secondary | ICD-10-CM | POA: Diagnosis not present

## 2019-10-17 DIAGNOSIS — R1310 Dysphagia, unspecified: Secondary | ICD-10-CM | POA: Diagnosis not present

## 2019-10-17 DIAGNOSIS — H1045 Other chronic allergic conjunctivitis: Secondary | ICD-10-CM | POA: Diagnosis not present

## 2019-10-17 DIAGNOSIS — J3089 Other allergic rhinitis: Secondary | ICD-10-CM | POA: Diagnosis not present

## 2019-10-17 DIAGNOSIS — R05 Cough: Secondary | ICD-10-CM | POA: Diagnosis not present

## 2019-10-19 DIAGNOSIS — L578 Other skin changes due to chronic exposure to nonionizing radiation: Secondary | ICD-10-CM | POA: Diagnosis not present

## 2019-10-19 DIAGNOSIS — X32XXXS Exposure to sunlight, sequela: Secondary | ICD-10-CM | POA: Diagnosis not present

## 2019-10-19 DIAGNOSIS — L570 Actinic keratosis: Secondary | ICD-10-CM | POA: Diagnosis not present

## 2019-10-19 DIAGNOSIS — L738 Other specified follicular disorders: Secondary | ICD-10-CM | POA: Diagnosis not present

## 2019-10-19 DIAGNOSIS — L814 Other melanin hyperpigmentation: Secondary | ICD-10-CM | POA: Diagnosis not present

## 2019-11-15 DIAGNOSIS — J0101 Acute recurrent maxillary sinusitis: Secondary | ICD-10-CM | POA: Diagnosis not present

## 2019-11-15 DIAGNOSIS — J029 Acute pharyngitis, unspecified: Secondary | ICD-10-CM | POA: Diagnosis not present

## 2019-11-25 DIAGNOSIS — Z6829 Body mass index (BMI) 29.0-29.9, adult: Secondary | ICD-10-CM | POA: Diagnosis not present

## 2019-11-25 DIAGNOSIS — Z780 Asymptomatic menopausal state: Secondary | ICD-10-CM | POA: Diagnosis not present

## 2019-11-25 DIAGNOSIS — Z01419 Encounter for gynecological examination (general) (routine) without abnormal findings: Secondary | ICD-10-CM | POA: Diagnosis not present

## 2019-11-25 DIAGNOSIS — N952 Postmenopausal atrophic vaginitis: Secondary | ICD-10-CM | POA: Diagnosis not present

## 2019-11-29 ENCOUNTER — Ambulatory Visit: Payer: BC Managed Care – PPO | Admitting: Internal Medicine

## 2019-11-29 ENCOUNTER — Encounter: Payer: Self-pay | Admitting: Internal Medicine

## 2019-11-29 ENCOUNTER — Other Ambulatory Visit: Payer: Self-pay

## 2019-11-29 VITALS — BP 120/72 | HR 84 | Temp 97.3°F | Ht 63.0 in | Wt 166.4 lb

## 2019-11-29 DIAGNOSIS — Z8709 Personal history of other diseases of the respiratory system: Secondary | ICD-10-CM

## 2019-11-29 DIAGNOSIS — Z9109 Other allergy status, other than to drugs and biological substances: Secondary | ICD-10-CM

## 2019-11-29 DIAGNOSIS — R053 Chronic cough: Secondary | ICD-10-CM | POA: Diagnosis not present

## 2019-11-29 DIAGNOSIS — U099 Post covid-19 condition, unspecified: Secondary | ICD-10-CM | POA: Diagnosis not present

## 2019-11-29 NOTE — Progress Notes (Signed)
OV 11/29/2019  Subjective:  Patient ID: Mary Orr, female , DOB: 26-Feb-1955 , age 64 y.o. , MRN: [id number] , ADDRESS: [id number] [place] [address] PCP Chesley Noon, MD Patient Care Team: Chesley Noon, MD as PCP - General (Family Medicine)  This Provider for this visit: Treatment Team:  Attending Provider: Brand Males, MD  11/29/2019 -   Chief Complaint  Patient presents with  . Consult    Post-covid, long-hauler cough, Covid las   HPI Mary Orr 64 y.o. - with a PMHx of allergies following by Dr. Harold Hedge, COVID-19 pneumonia in October 2020 with post-COVID-19 syndrome, hypothyroidism, GERD who is here today for evaluation of chronic cough. Mary Orr states that she first contracted COVID-19 in October 23020 and was hospitalized for a short period of time. After discharge, she continued to experience significant fatigue with excessive sleepiness that lasted up till 1.5 months ago. During this time, she was sleeping approximately 10-12 hours per day. She also experienced both dry and productive cough that is worst in the morning but occurs at night, at rest and with exertion throughout the day. The cough has improved somewhat but has still persisted. It is not necessarily improved with rest or drinking sips of water. Mary Orr states that her family has mentioned she is coughing in the night-time but she does not always wake for it. In the past year, Mary Orr has also experienced persistent SOB that is worsened with exertion. For these symptoms, she has followed up with Dr. Michela Pitcher in [place]. At the time, she underwent PFTs and lab work (please see below), that was largely negative. Mary Orr states she was told her symptoms may be related to underlying sleep disorder and recommendation was for sleep studies, however she has decided not to complete these after following up with her PCP. Overall, her fatigue and GI symptoms from COVID-19 improved  the most, however her cough and SOB have persisted.   In the past 2 weeks, Mary Orr states she has also experienced a viral URI that affected her entire family. She has worsening in her cough and SOB during this time. Her PCP prescribed her Amoxicillin and Prednisone; she completed the Amoxicillin course 4 days, however did not begin the Prednisone pack. She has begun to feel better at this time.   She denies any previous history of lung disease other than asthma. No family history of ILD or rheumatological disorders.   PFTs (04/27/2019): Read by Dr. Michela Pitcher as normal on [facility]. Unable to obtain full results.   CTA (10/29/2018) IMPRESSION:  1. No pulmonary embolism.  2. No aortic aneurysm or dissection.  3. Peripheral areas of interstitial thickening/scarring bilaterally. Multifocal groundglass opacity likely represent superimposed atypical viral pneumonia..   Echo (12/28/2018)  Normal left ventricular structure and size. Wall thickness is normal.  Systolic function is normal with an ejection fraction of 55-65%. Wall  motion is within normal limits. Unable to assess diastolic function. There  is no thrombus.  Complement C3, Serum 82 - 167 mg/dL 141        C4 Complement 12 - 38 mg/dL 19        ANA Direct Negative  Negative        RA Quant 0.0 - 13.9 IU/mL <10.0        Anti-DNA (DS) Ab Qn 0 - 9 IU/mL <1  Negative   <5                   Equivocal 5 - 9                   Positive   >9      Sjogren's Antibodies(SSA) 0.0 - 0.9 AI <0.2        Sjogren's Antibodies(SSB) 0.0 - 0.9 AI <0.2        RNP Antibodies 0.0 - 0.9 AI 0.4        Smith Antibodies 0.0 - 0.9 AI <0.2     Antimyeloperoxidase (MPO) Abs 0.0 - 9.0 U/mL <9.0        Antiproteinase 3 (PR-3) Abs 0.0 - 3.5 U/mL <3.5        C-ANCA Neg:<1:20 titer <1:20        P-ANCA Neg:<1:20 titer <1:20     Total IgG 586 - 1,602 mg/dL 674      IgA  87 - 352 mg/dL 115      IgM 26 - 217 mg/dL 225High      Immunoglobulin E, Total 6 - 495 IU/mL 4Low     Dr Lorenza Cambridge Reflux Symptom Index (> 13-15 suggestive of LPR cough) 0 -> 5  =  none ->severe problem  Hoarseness of problem with voice 1  Clearing  Of Throat 1  Excess throat mucus or feeling of post nasal drip 3  Difficulty swallowing food, liquid or tablets 3  Cough after eating or lying down 2  Breathing difficulties or choking episodes 1  Troublesome or annoying cough 4  Sensation of something sticking in throat or lump in throat 1  Heartburn, chest pain, indigestion, or stomach acid coming up 3  TOTAL 19   SYMPTOM SCALE - ILD   O2 use None  Shortness of Breath 0 -> 5 scale with 5 being worst (score 6 If unable to do)  At rest 0  Simple tasks - showers, clothes change, eating, shaving 0  Household (dishes, doing bed, laundry) 2  Shopping 1  Walking level at own pace 2  Walking up Stairs 3  Total (30-36) Dyspnea Score 8  How bad is your cough? 2  How bad is your fatigue 1  How bad is nausea 0  How bad is vomiting?  0  How bad is diarrhea? 1  How bad is anxiety? 0  How bad is depression 0   ROS - per HPI   has a past medical history of Thyroid disease.   reports that she has never smoked. She has never used smokeless tobacco.  Past Surgical History:  Procedure Laterality Date  . BACK SURGERY    . NASAL SINUS SURGERY      No Known Allergies  Immunization History  Administered Date(s) Administered  . Influenza,inj,Quad PF,6+ Mos 10/28/2016, 10/29/2017  . Influenza,trivalent, recombinat, inj, PF 12/01/2011  . PFIZER SARS-COV-2 Vaccination 05/03/2019, 05/24/2019  . Tdap 05/31/2015    History reviewed. No pertinent family history.   Current Outpatient Medications:  .  aspirin 81 MG EC tablet, Take by mouth., Disp: , Rfl:  .  azelastine (ASTELIN) 0.1 % nasal spray, 2 sprays each nostril four times daily for 3-5 days then twice a day, Disp: , Rfl:  .   beclomethasone (QVAR REDIHALER) 40 MCG/ACT inhaler, Inhale into the lungs., Disp: , Rfl:  .  buPROPion (WELLBUTRIN XL) 150 MG 24 hr tablet, TAKE 1 TABLET(150 MG) BY MOUTH DAILY, Disp: , Rfl:  .  Diclofenac Sodium CR 100 MG 24 hr tablet, Take by mouth., Disp: , Rfl:  .  EPINEPHrine 0.3 mg/0.3 mL IJ SOAJ injection, Inject 0.3 mLs (0.3 mg total) into the muscle as needed for anaphylaxis., Disp: 1 each, Rfl: 0 .  Estradiol 10 MCG TABS vaginal tablet, Place vaginally., Disp: , Rfl:  .  levocetirizine (XYZAL) 5 MG tablet, Take by mouth., Disp: , Rfl:  .  levothyroxine (SYNTHROID) 75 MCG tablet, Take 75 mcg by mouth daily before breakfast., Disp: , Rfl:  .  Multiple Vitamin (MULTIVITAMIN) capsule, Take 1 capsule by mouth daily., Disp: , Rfl:  .  pantoprazole (PROTONIX) 20 MG tablet, Take 1 tablet (20 mg total) by mouth 2 (two) times daily before a meal., Disp: 60 tablet, Rfl: 3 .  albuterol (VENTOLIN HFA) 108 (90 Base) MCG/ACT inhaler, Inhale into the lungs. (Patient not taking: Reported on 11/29/2019), Disp: , Rfl:      Objective:   Vitals:   11/29/19 0945  BP: 120/72  Pulse: 84  Temp: (!) 97.3 F (36.3 C)  TempSrc: Oral  SpO2: 97%  Weight: 166 lb 6.4 oz (75.5 kg)  Height: 5' 3"  (1.6 m)    Estimated body mass index is 29.48 kg/m as calculated from the following:   Height as of this encounter: 5' 3"  (1.6 m).   Weight as of this encounter: 166 lb 6.4 oz (75.5 kg).  Filed Weights   11/29/19 0945  Weight: 166 lb 6.4 oz (75.5 kg)   General: No distress. Appears stated age. Normal weight.  Neuro: Alert and Oriented x 3. GCS 15. Speech normal. Gait intact. No focal weakness.  Psych: Pleasant. Normal mood and behavior.  Resp:  Barrel Chest - Negative.  Wheeze - Negative, Crackles - Negative, No overt respiratory distress. No accessory muscle use. Cough - Dry, barking nature  CVS: Normal heart sounds. Murmurs - Negative. Normal S1 S2.  Ext: Stigmata of Connective Tissue Disease -  Negative HEENT: Normal upper airway. PEERL +. Positive for post nasal drip    Assessment:       ICD-10-CM   1. Chronic cough  R05.3   2. Post-COVID syndrome  U09.9   3. History of asthma  Z87.09   4. History of environmental allergies  Z91.09    1. Post-COVID syndrome 2. Chronic cough Patient's persistent cough may be related to continued post-viral syndrome although patient has experienced improvement in her other symptoms. It is possible patient has developed fibrosis and will investigate this with HRCT. Previous CTA showed some possible early fibrosis in the bilateral basilar regions although this may also represent atelectasis. No hx of rheumatological and recent work up with ANA, ESR, CRP, ANCA all negative.   With history of asthma, cough and SOB may represent lack of control and need for additional treatment. Will obtain PFTs.   Given the nature of her cough, differential also includes cough neuropathy, however Mary Orr does not have the typical symptoms expected, including improvement of cough at rest, with sleep or when sipping water. Requested patient follow up with her ENT as well.  Will hold off on beginning any treatment until results are in.    Plan:     Patient Instructions  1. Post-COVID syndrome 2. Chronic cough Happy to hear that your fatigue is improving! For your cough and difficulty breathing, we would like to complete additional work up.   Plan:  - Obtain High-resolution CT scan, supine and prone  - Full Pulmonary Function Tests  -  Follow up with your ENT in [place]   3. History of asthma 4. History of environmental allergies  Plan:  - Continue to follow up with Dr. Harold Hedge    Follow up Plan:  - Follow up with Dr. Chase Caller or his nurse practitioner, whoever is first available, in the next few weeks to discuss results.     SIGNATURE    Dr. Jose Persia Internal Medicine PGY-2  11/29/2019, 11:20 AM

## 2019-11-29 NOTE — Patient Instructions (Addendum)
1. Post-COVID syndrome 2. Chronic cough Happy to hear that your fatigue is improving! For your cough and difficulty breathing, we would like to complete additional work up.   Plan:  - Obtain High-resolution CT scan, supine and prone  - Full Pulmonary Function Tests  - Follow up with your ENT in [place]   3. History of asthma 4. History of environmental allergies  Plan:  - Continue to follow up with Dr. Irena Cords    Follow up Plan:  - Follow up with Dr. Marchelle Gearing or his nurse practitioner, whoever is first available, in the next few weeks to discuss results.

## 2019-12-05 ENCOUNTER — Other Ambulatory Visit: Payer: Self-pay

## 2019-12-05 ENCOUNTER — Ambulatory Visit (INDEPENDENT_AMBULATORY_CARE_PROVIDER_SITE_OTHER): Payer: BC Managed Care – PPO

## 2019-12-05 DIAGNOSIS — U099 Post covid-19 condition, unspecified: Secondary | ICD-10-CM | POA: Diagnosis not present

## 2019-12-05 DIAGNOSIS — R059 Cough, unspecified: Secondary | ICD-10-CM | POA: Diagnosis not present

## 2019-12-05 DIAGNOSIS — R053 Chronic cough: Secondary | ICD-10-CM | POA: Diagnosis not present

## 2019-12-05 DIAGNOSIS — Z8709 Personal history of other diseases of the respiratory system: Secondary | ICD-10-CM

## 2019-12-23 ENCOUNTER — Ambulatory Visit: Payer: BC Managed Care – PPO | Admitting: Pulmonary Disease

## 2019-12-23 ENCOUNTER — Ambulatory Visit (INDEPENDENT_AMBULATORY_CARE_PROVIDER_SITE_OTHER): Payer: BC Managed Care – PPO | Admitting: Internal Medicine

## 2019-12-23 ENCOUNTER — Other Ambulatory Visit: Payer: Self-pay

## 2019-12-23 ENCOUNTER — Encounter: Payer: Self-pay | Admitting: Pulmonary Disease

## 2019-12-23 VITALS — BP 118/70 | HR 85 | Temp 97.5°F | Ht 63.0 in | Wt 166.0 lb

## 2019-12-23 DIAGNOSIS — R918 Other nonspecific abnormal finding of lung field: Secondary | ICD-10-CM

## 2019-12-23 DIAGNOSIS — Z8709 Personal history of other diseases of the respiratory system: Secondary | ICD-10-CM

## 2019-12-23 DIAGNOSIS — J849 Interstitial pulmonary disease, unspecified: Secondary | ICD-10-CM | POA: Diagnosis not present

## 2019-12-23 DIAGNOSIS — U099 Post covid-19 condition, unspecified: Secondary | ICD-10-CM

## 2019-12-23 DIAGNOSIS — K219 Gastro-esophageal reflux disease without esophagitis: Secondary | ICD-10-CM | POA: Diagnosis not present

## 2019-12-23 DIAGNOSIS — Z23 Encounter for immunization: Secondary | ICD-10-CM | POA: Diagnosis not present

## 2019-12-23 DIAGNOSIS — Z0000 Encounter for general adult medical examination without abnormal findings: Secondary | ICD-10-CM

## 2019-12-23 DIAGNOSIS — R053 Chronic cough: Secondary | ICD-10-CM

## 2019-12-23 LAB — PULMONARY FUNCTION TEST
DL/VA % pred: 120 %
DL/VA: 5.08 ml/min/mmHg/L
DLCO cor % pred: 120 %
DLCO cor: 23.31 ml/min/mmHg
DLCO unc % pred: 120 %
DLCO unc: 23.31 ml/min/mmHg
FEF 25-75 Post: 3.9 L/sec
FEF 25-75 Pre: 2.44 L/sec
FEF2575-%Change-Post: 59 %
FEF2575-%Pred-Post: 176 %
FEF2575-%Pred-Pre: 110 %
FEV1-%Change-Post: 12 %
FEV1-%Pred-Post: 103 %
FEV1-%Pred-Pre: 91 %
FEV1-Post: 2.48 L
FEV1-Pre: 2.2 L
FEV1FVC-%Change-Post: 5 %
FEV1FVC-%Pred-Pre: 106 %
FEV6-%Change-Post: 6 %
FEV6-%Pred-Post: 93 %
FEV6-%Pred-Pre: 87 %
FEV6-Post: 2.83 L
FEV6-Pre: 2.65 L
FEV6FVC-%Pred-Post: 103 %
FEV6FVC-%Pred-Pre: 103 %
FVC-%Change-Post: 6 %
FVC-%Pred-Post: 90 %
FVC-%Pred-Pre: 84 %
FVC-Post: 2.83 L
FVC-Pre: 2.65 L
Post FEV1/FVC ratio: 88 %
Post FEV6/FVC ratio: 100 %
Pre FEV1/FVC ratio: 83 %
Pre FEV6/FVC Ratio: 100 %
RV % pred: 123 %
RV: 2.45 L
TLC % pred: 107 %
TLC: 5.27 L

## 2019-12-23 MED ORDER — CHLORPHENIRAMINE MALEATE 4 MG PO TABS: ORAL_TABLET | ORAL | 3 refills | Status: DC

## 2019-12-23 MED ORDER — BUDESONIDE-FORMOTEROL FUMARATE 80-4.5 MCG/ACT IN AERO: 2.0000 | INHALATION_SPRAY | Freq: Two times a day (BID) | RESPIRATORY_TRACT | 0 refills | Status: DC

## 2019-12-23 NOTE — Progress Notes (Signed)
@Mary Orr  ID: , female    DOB: [birth year], 64 y.o.   MRN: [id number]  Chief Complaint  Mary Orr presents with  . Follow-up    PFT done today. Cough is about the same. She notices it mainly at night when she lies down- occ prod with clear sputum. She has not used any inhalers recently.     Referring provider: [id number], MD  HPI:  64 year old female never smoker initially referred to our office in November/2021 for chronic cough and post Covid 19 syndrome.  Hospitalized with COVID-19 pneumonia in October/2020.  PMH: Hypothyroidism, GERD Smoker/ Smoking History: Never smoker Maintenance: Qvar ( used seasonally - managed by asthma)  Pt of: Dr. November/2020  12/23/2019  - Visit   64 year old female never smoker followed in our office for chronic cough and post Covid syndrome.  She is established with Dr. 68.  She was last seen on 11/29/2019.  It was recommended at that office visit that she obtain a high-resolution CT chest, obtain pulmonary function testing and continue to follow-up with ENT in [place], continue follow-up with Dr. Teaneck for allergy asthma.  Mary Orr presenting today after completing pulmonary function testing those results are listed below:  12/23/2019-pulmonary function test-FVC 2.65 (84% predicted), postbronchodilator ratio 88, postbronchodilator FEV1 2.48 (103% predicted), positive bronchodilator response in the FEV1, TLC 5.27 (107% predicted), DLCO 23.31 (120% predicted)  Per consult note on 11/29/2019 Mary Orr was diagnosed with COVID-19 pneumonia in October/2020.  She was hospitalized.  Mary Orr reporting today she never required a mechanical ventilator.  She did receive steroids.  She also received high flow nasal cannula as initial oxygenation when hospitalized was in the low 80s.  Mary Orr is a November/2020.  She is also noticed that over the last year she has had ongoing difficulty with singing.  Prior to COVID-19 infection  Mary Orr was very active walking between 5 to 10 miles a day.  She reports that she is unable to even walk closer to a mile at this point in time.  She still deals with ongoing fatigue.  She did not notice or remember a significant improvement in her symptoms when she was taking Qvar.  She takes Qvar seasonally as managed by Dr. Holiday representative her allergist.  She also takes Xyzal.  She has considerable amount of postnasal drip.  She reports a cough most evenings when laying flat.  Questionaires / Pulmonary Flowsheets:   ACT:  No flowsheet data found.  MMRC: No flowsheet data found.  Epworth:  No flowsheet data found.  Tests:   12/05/2019-CT chest high-res-faint subpleural groundglass in the left lower lobe is nonspecific but may reflect subtle post COVID-19 inflammatory fibrosis, otherwise no evidence of interstitial lung disease, mild air trapping is indicative of small airways disease, marginal irregularity in the liver is indicative of cirrhosis, aortic arthrosclerosis  12/23/2019-pulmonary function test-FVC 2.65 (84% predicted), postbronchodilator ratio 88, postbronchodilator FEV1 2.48 (103% predicted), positive bronchodilator response in the FEV1, TLC 5.27 (107% predicted), DLCO 23.31 (120% predicted)  FENO:  No results found for: NITRICOXIDE  PFT: PFT Results Latest Ref Rng & Units 12/23/2019  FVC-Pre L 2.65  FVC-Predicted Pre % 84  FVC-Post L 2.83  FVC-Predicted Post % 90  Pre FEV1/FVC % % 83  Post FEV1/FCV % % 88  FEV1-Pre L 2.20  FEV1-Predicted Pre % 91  FEV1-Post L 2.48  DLCO uncorrected ml/min/mmHg 23.31  DLCO UNC% % 120  DLCO corrected ml/min/mmHg 23.31  DLCO COR %Predicted % 120  DLVA Predicted %  120  TLC L 5.27  TLC % Predicted % 107  RV % Predicted % 123    WALK:  No flowsheet data found.  Imaging: CT Chest High Resolution  Result Date: 12/05/2019 CLINICAL DATA:  Chronic cough since COVID in September 2020. EXAM: CT CHEST WITHOUT CONTRAST TECHNIQUE:  Multidetector CT imaging of the chest was performed following the standard protocol without intravenous contrast. High resolution imaging of the lungs, as well as inspiratory and expiratory imaging, was performed. COMPARISON:  07/26/2018. FINDINGS: Cardiovascular: Atherosclerotic calcification of the aorta. Heart is at the upper limits of normal in size. No pericardial effusion. Mediastinum/Nodes: No pathologically enlarged mediastinal or axillary lymph nodes. Hilar regions are difficult to evaluate without IV contrast. Esophagus is grossly unremarkable. Lungs/Pleura: Linear scarring in the posterior right upper lobe. Mild central bronchiectasis. Negative for subpleural reticulation, traction bronchiectasis/bronchiolectasis, architectural distortion or honeycombing. Faint areas of subpleural ground-glass in the left lower lobe. Lungs are otherwise clear. No pleural fluid. Airway is unremarkable. Mild air trapping. Upper Abdomen: Liver margin is slightly irregular. Subcentimeter low-attenuation lesions in the right hepatic lobe are too small to characterize. Cholecystectomy. Visualized portions of the adrenal glands, kidneys, spleen pancreas and stomach are grossly unremarkable. Musculoskeletal: No worrisome lytic or sclerotic lesions. IMPRESSION: 1. Faint subpleural ground-glass in the left lower lobe is nonspecific but may reflect subtle post COVID-19 inflammatory fibrosis. Otherwise, no evidence of interstitial lung disease. 2. Mild air trapping is indicative of small airways disease. 3. Marginal irregularity of the liver is indicative of cirrhosis. 4.  Aortic atherosclerosis (ICD10-I70.0). Electronically Signed   By: Leanna Battles M.D.   On: 12/05/2019 13:04    Lab Results:  CBC    Component Value Date/Time   WBC 12.0 (H) 07/26/2018 0051   RBC 4.50 07/26/2018 0051   HGB 13.3 07/26/2018 0051   HCT 40.8 07/26/2018 0051   PLT 277 07/26/2018 0051   MCV 90.7 07/26/2018 0051   MCH 29.6 07/26/2018 0051    MCHC 32.6 07/26/2018 0051   RDW 13.3 07/26/2018 0051   LYMPHSABS 2.7 07/26/2018 0051   MONOABS 1.1 (H) 07/26/2018 0051   EOSABS 0.5 07/26/2018 0051   BASOSABS 0.1 07/26/2018 0051    BMET    Component Value Date/Time   NA 139 07/26/2018 0051   K 3.7 07/26/2018 0051   CL 105 07/26/2018 0051   CO2 25 07/26/2018 0051   GLUCOSE 108 (H) 07/26/2018 0051   BUN 13 07/26/2018 0051   CREATININE 0.70 07/26/2018 0051   CALCIUM 8.9 07/26/2018 0051   GFRNONAA >60 07/26/2018 0051   GFRAA >60 07/26/2018 0051    BNP No results found for: BNP  ProBNP No results found for: PROBNP  Specialty Problems      Pulmonary Problems   Chronic cough   ILD (interstitial lung disease) (HCC)      No Known Allergies  Immunization History  Administered Date(s) Administered  . Influenza,inj,Quad PF,6+ Mos 10/28/2016, 10/29/2017, 12/23/2019  . Influenza,trivalent, recombinat, inj, PF 12/01/2011  . PFIZER SARS-COV-2 Vaccination 05/03/2019, 05/24/2019  . Tdap 05/31/2015    Past Medical History:  Diagnosis Date  . Thyroid disease     Tobacco History: Social History   Tobacco Use  Smoking Status Never Smoker  Smokeless Tobacco Never Used   Counseling given: Not Answered   Continue to not smoke  Outpatient Encounter Medications as of 12/23/2019  Medication Sig  . albuterol (VENTOLIN HFA) 108 (90 Base) MCG/ACT inhaler Inhale into the lungs.   Marland Kitchen aspirin 81  MG EC tablet Take by mouth.  Marland Kitchen. azelastine (ASTELIN) 0.1 % nasal spray 2 sprays each nostril four times daily for 3-5 days then twice a day  . buPROPion (WELLBUTRIN XL) 150 MG 24 hr tablet TAKE 1 TABLET(150 MG) BY MOUTH DAILY  . Diclofenac Sodium CR 100 MG 24 hr tablet Take by mouth.  . EPINEPHrine 0.3 mg/0.3 mL IJ SOAJ injection Inject 0.3 mLs (0.3 mg total) into the muscle as needed for anaphylaxis.  . Estradiol 10 MCG TABS vaginal tablet Place vaginally.  Marland Kitchen. levocetirizine (XYZAL) 5 MG tablet Take by mouth.  . levothyroxine  (SYNTHROID) 75 MCG tablet Take 75 mcg by mouth daily before breakfast.  . Multiple Vitamin (MULTIVITAMIN) capsule Take 1 capsule by mouth daily.  . pantoprazole (PROTONIX) 20 MG tablet Take 1 tablet (20 mg total) by mouth 2 (two) times daily before a meal.  . beclomethasone (QVAR REDIHALER) 40 MCG/ACT inhaler Inhale into the lungs. (Mary Orr not taking: Reported on 12/23/2019)  . budesonide-formoterol (SYMBICORT) 80-4.5 MCG/ACT inhaler Inhale 2 puffs into the lungs in the morning and at bedtime.  . chlorpheniramine (CHLOR-TRIMETON) 4 MG tablet chlorpheniramine -aka Chlor tabs - 4 mg tablet -1 to 2 tablets at night- for management of allergies and postnasal drip at night   No facility-administered encounter medications on file as of 12/23/2019.     Review of Systems  Review of Systems  Constitutional: Positive for fatigue. Negative for activity change and fever.  HENT: Positive for congestion. Negative for sinus pressure, sinus pain and sore throat.   Respiratory: Positive for cough and shortness of breath. Negative for wheezing.   Cardiovascular: Negative for chest pain and palpitations.  Gastrointestinal: Negative for diarrhea, nausea and vomiting.  Musculoskeletal: Negative for arthralgias.  Neurological: Negative for dizziness.  Psychiatric/Behavioral: Negative for sleep disturbance. The Mary Orr is not nervous/anxious.      Physical Exam  BP 118/70 (BP Location: Left Arm, Cuff Size: Normal)   Pulse 85   Temp (!) 97.5 F (36.4 C) (Temporal)   Ht 5\' 3"  (1.6 m)   Wt 166 lb (75.3 kg)   SpO2 97% Comment: on RA  BMI 29.41 kg/m   Wt Readings from Last 5 Encounters:  12/23/19 166 lb (75.3 kg)  11/29/19 166 lb 6.4 oz (75.5 kg)  09/10/18 165 lb (74.8 kg)  07/25/18 170 lb (77.1 kg)    BMI Readings from Last 5 Encounters:  12/23/19 29.41 kg/m  11/29/19 29.48 kg/m  09/10/18 29.23 kg/m  07/25/18 30.11 kg/m    Physical Exam Vitals and nursing note reviewed.  Constitutional:       General: She is not in acute distress.    Appearance: Normal appearance. She is normal weight.  HENT:     Head: Normocephalic and atraumatic.     Right Ear: External ear normal.     Left Ear: External ear normal.     Nose: Rhinorrhea present. No congestion.     Mouth/Throat:     Mouth: Mucous membranes are moist.     Pharynx: Oropharynx is clear.     Comments: +PND Eyes:     Pupils: Pupils are equal, round, and reactive to light.  Cardiovascular:     Rate and Rhythm: Normal rate and regular rhythm.     Pulses: Normal pulses.     Heart sounds: Normal heart sounds. No murmur heard.   Pulmonary:     Effort: Pulmonary effort is normal. No respiratory distress.     Breath sounds: No decreased air  movement. No decreased breath sounds, wheezing or rales.  Musculoskeletal:     Cervical back: Normal range of motion.  Skin:    General: Skin is warm and dry.     Capillary Refill: Capillary refill takes less than 2 seconds.  Neurological:     General: No focal deficit present.     Mental Status: She is alert and oriented to person, place, and time. Mental status is at baseline.     Gait: Gait normal.  Psychiatric:        Mood and Affect: Mood normal.        Behavior: Behavior normal.        Thought Content: Thought content normal.        Judgment: Judgment normal.      Assessment & Plan:   ILD (interstitial lung disease) (HCC) Covid ILD seen on high-resolution CT chest Reviewed HRCT with Mary Orr Reviewed pulmonary function testing with Mary Orr  Plan: We will continue to clinically monitor Referral to pulmonary rehab-Mary Orr would prefer [facility] med center for pulmonary rehab, if not available then will do pulmonary rehab at [facility]  GERD (gastroesophageal reflux disease) Plan: Continue Protonix Start taking Protonix 20 mg in the morning 15 to 30 minutes before first meal the day Discussed with primary care if you could start trying to titrate off of  this were discussed with gastroenterology  Abnormal findings on diagnostic imaging of lung Suspected Covid ILD seen on high-resolution CT   Plan: Continue to clinically monitor  Chronic cough To be multifactorial given post Covid ILD, post Covid syndrome, GERD, allergic rhinitis, postnasal drip  Plan: Continue Xyzal Start chlor tabs at night Continue Protonix   Healthcare maintenance Plan: Obtain COVID-19 booster towards the end of December/2021  Post-COVID syndrome HRCT shows potential Covid ILD Reviewed pulmonary function testing with Mary Orr  Plan: Lab work today Therapeutic trial of Symbicort 80, 2 puffs twice daily Would encourage Mary Orr to consider COVID-19 booster vaccination towards the end of December/2021 Referral to pulmonary rehab     Return in about 2 months (around 02/22/2020), or if symptoms worsen or fail to improve, for Follow up with Dr. Dalbert Mayotte.   Coral Ceo, NP 12/23/2019   This appointment required 42 minutes of Mary Orr care (this includes precharting, chart review, review of results, face-to-face care, etc.).

## 2019-12-23 NOTE — Assessment & Plan Note (Signed)
HRCT shows potential Covid ILD Reviewed pulmonary function testing with patient  Plan: Lab work today Therapeutic trial of Symbicort 80, 2 puffs twice daily Would encourage patient to consider COVID-19 booster vaccination towards the end of December/2021 Referral to pulmonary rehab

## 2019-12-23 NOTE — Assessment & Plan Note (Signed)
Covid ILD seen on high-resolution CT chest Reviewed HRCT with patient Reviewed pulmonary function testing with patient  Plan: We will continue to clinically monitor Referral to pulmonary rehab-patient would prefer [facility] med center for pulmonary rehab, if not available then will do pulmonary rehab at [facility]

## 2019-12-23 NOTE — Progress Notes (Signed)
Full PFT performed today. °

## 2019-12-23 NOTE — Assessment & Plan Note (Addendum)
Suspected Covid ILD seen on high-resolution CT   Plan: Continue to clinically monitor

## 2019-12-23 NOTE — Assessment & Plan Note (Signed)
Plan: Continue Protonix Start taking Protonix 20 mg in the morning 15 to 30 minutes before first meal the day Discussed with primary care if you could start trying to titrate off of this were discussed with gastroenterology

## 2019-12-23 NOTE — Patient Instructions (Addendum)
You were seen today by Coral Ceo, NP  for:   1. Post-COVID syndrome  - SAR CoV2 Serology (COVID 19)AB(IGG)IA; Future  Start Symbicort 80 >>> 2 puffs in the morning right when you wake up, rinse out your mouth after use, 12 hours later 2 puffs, rinse after use >>> Take this daily, no matter what >>> This is not a rescue inhaler   Please let us know how you are doing on this inhaler  We will refer to pulmonary rehab due to your post Covid syndrome/ILD  2. Chronic cough  - chlorpheniramine (CHLOR-TRIMETON) 4 MG tablet; chlorpheniramine -aka Chlor tabs - 4 mg tablet -1 to 2 tablets at night- for management of allergies and postnasal drip at night  Dispense: 60 tablet; Refill: 3  Start taking Xyzal in the morning  Please start taking chlorpheniramine (aka Chlor tabs) 4 mg tablet (1 to 2 tablets at night) for management of allergies and postnasal drip at night >>> This is an over-the-counter medication >>> This medication is sedating   3. Healthcare maintenance  Would recommend considering COVID-19 booster vaccination towards the end of December/2021  May change recommendations based off of lab work  4. Gastroesophageal reflux disease, unspecified whether esophagitis present  Protonix 20 mg tablet  >>>Please take 1 tablet daily 15 minutes to 30 minutes before your first meal of the day as well as before your other medications >>>Try to take at the same time each day >>>take this medication daily  GERD management: >>>Avoid laying flat until 2 hours after meals >>>Elevate head of the bed including entire chest >>>Reduce size of meals and amount of fat, acid, spices, caffeine and sweets >>>If you are smoking, Please stop! >>>Decrease alcohol consumption >>>Work on maintaining a healthy weight with normal BMI     We recommend today:  Orders Placed This Encounter  Procedures  . SAR CoV2 Serology (COVID 19)AB(IGG)IA    Standing Status:   Future    Standing Expiration  Date:   12/22/2020    Order Specific Question:   Is this test for diagnosis or screening    Answer:   Screening    Order Specific Question:   Symptomatic for COVID-19 as defined by CDC    Answer:   No    Order Specific Question:   Hospitalized for COVID-19    Answer:   No    Order Specific Question:   Admitted to ICU for COVID-19    Answer:   No    Order Specific Question:   Previously tested for COVID-19    Answer:   No    Order Specific Question:   Resident in a congregate (group) care setting    Answer:   No    Order Specific Question:   Employed in healthcare setting    Answer:   No    Order Specific Question:   Pregnant    Answer:   No   Orders Placed This Encounter  Procedures  . SAR CoV2 Serology (COVID 19)AB(IGG)IA   Meds ordered this encounter  Medications  . chlorpheniramine (CHLOR-TRIMETON) 4 MG tablet    Sig: chlorpheniramine -aka Chlor tabs - 4 mg tablet -1 to 2 tablets at night- for management of allergies and postnasal drip at night    Dispense:  60 tablet    Refill:  3    Follow Up:    Return in about 2 months (around 02/22/2020), or if symptoms worsen or fail to improve, for Follow up with Dr.  Ramaswammy.   Notification of test results are managed in the following manner: If there are  any recommendations or changes to the  plan of care discussed in office today,  we will contact you and let you know what they are. If you do not hear from [place], then your results are normal and you can view them through your  MyChart account , or a letter will be sent to you. Thank you again for trusting [place] with your care  - Thank you, [facility]    It is flu season:   >>> Best ways to protect herself from the flu: Receive the yearly flu vaccine, practice good hand hygiene washing with soap and also using hand sanitizer when available, eat a nutritious meals, get adequate rest, hydrate appropriately       Please contact the office if your symptoms worsen or you have  concerns that you are not improving.   Thank you for choosing [facility] for your healthcare, and for allowing [place] to partner with you on your healthcare journey. I am thankful to be able to provide care to you today.   Elisha Headland FNP-C

## 2019-12-23 NOTE — Assessment & Plan Note (Signed)
To be multifactorial given post Covid ILD, post Covid syndrome, GERD, allergic rhinitis, postnasal drip  Plan: Continue Xyzal Start chlor tabs at night Continue Protonix

## 2019-12-23 NOTE — Assessment & Plan Note (Signed)
Plan: Obtain COVID-19 booster towards the end of December/2021

## 2019-12-26 LAB — SARS-COV-2 ANTIBODY(IGG)SPIKE,SEMI-QUANTITATIVE: SARS COV1 AB(IGG)SPIKE,SEMI QN: >150 index — ABNORMAL HIGH (ref <1.00)

## 2019-12-27 ENCOUNTER — Telehealth (HOSPITAL_COMMUNITY): Payer: Self-pay

## 2019-12-27 NOTE — Telephone Encounter (Signed)
Called and spoke with pt in regards to [facility], pt stated [facility] is closer to her and would like to attend there.  Closed referral

## 2019-12-28 DIAGNOSIS — J3489 Other specified disorders of nose and nasal sinuses: Secondary | ICD-10-CM | POA: Diagnosis not present

## 2019-12-28 DIAGNOSIS — R6884 Jaw pain: Secondary | ICD-10-CM | POA: Diagnosis not present

## 2020-02-29 ENCOUNTER — Encounter: Payer: Self-pay | Admitting: Internal Medicine

## 2020-02-29 ENCOUNTER — Other Ambulatory Visit: Payer: Self-pay

## 2020-02-29 ENCOUNTER — Ambulatory Visit: Payer: BC Managed Care – PPO | Admitting: Internal Medicine

## 2020-02-29 VITALS — BP 114/70 | HR 89 | Temp 97.2°F | Ht 64.0 in | Wt 164.2 lb

## 2020-02-29 DIAGNOSIS — U099 Post covid-19 condition, unspecified: Secondary | ICD-10-CM

## 2020-02-29 DIAGNOSIS — R0600 Dyspnea, unspecified: Secondary | ICD-10-CM | POA: Diagnosis not present

## 2020-02-29 DIAGNOSIS — R230 Cyanosis: Secondary | ICD-10-CM

## 2020-02-29 DIAGNOSIS — R053 Chronic cough: Secondary | ICD-10-CM | POA: Diagnosis not present

## 2020-02-29 DIAGNOSIS — R0609 Other forms of dyspnea: Secondary | ICD-10-CM

## 2020-02-29 NOTE — Progress Notes (Signed)
OV 11/29/2019  Subjective:  Patient ID: Mary Orr, female , DOB: [birth year] , age 65 y.o. , MRN: [id number] , ADDRESS: [address] [place] [address] PCP Eartha Inch, MD Patient Care Team: Eartha Inch, MD as PCP - General (Family Medicine)  This Provider for this visit: Treatment Team:  Attending Provider: Kalman Shan, MD  11/29/2019 -   Chief Complaint  Patient presents with  . Consult    Post-covid, long-hauler cough, Covid las   HPI Mary Orr 65 y.o. - with a PMHx of allergies following by Dr. Irena Cords, COVID-19 pneumonia in October 2020 with post-COVID-19 syndrome, hypothyroidism, GERD who is here today for evaluation of chronic cough. Mary Orr states that she first contracted COVID-19 in October 23020 and was hospitalized for a short period of time. After discharge, she continued to experience significant fatigue with excessive sleepiness that lasted up till 1.5 months ago. During this time, she was sleeping approximately 10-12 hours per day. She also experienced both dry and productive cough that is worst in the morning but occurs at night, at rest and with exertion throughout the day. The cough has improved somewhat but has still persisted. It is not necessarily improved with rest or drinking sips of water. Mary Orr states that her family has mentioned she is coughing in the night-time but she does not always wake for it. In the past year, Mary Orr has also experienced persistent SOB that is worsened with exertion. For these symptoms, she has followed up with Dr. Jerre Simon in [place]. At the time, she underwent PFTs and lab work (please see below), that was largely negative. Mary Orr states she was told her symptoms may be related to underlying sleep disorder and recommendation was for sleep studies, however she has decided not to complete these after following up with her PCP. Overall, her fatigue and GI symptoms from COVID-19 improved  the most, however her cough and SOB have persisted.   In the past 2 weeks, Mary Orr states she has also experienced a viral URI that affected her entire family. She has worsening in her cough and SOB during this time. Her PCP prescribed her Amoxicillin and Prednisone; she completed the Amoxicillin course 4 days, however did not begin the Prednisone pack. She has begun to feel better at this time.   She denies any previous history of lung disease other than asthma. No family history of ILD or rheumatological disorders.   PFTs (04/27/2019): Read by Dr. Jerre Simon as normal on [facility]Everywhere. Unable to obtain full results.   CTA (10/29/2018) IMPRESSION:  1. No pulmonary embolism.  2. No aortic aneurysm or dissection.  3. Peripheral areas of interstitial thickening/scarring bilaterally. Multifocal groundglass opacity likely represent superimposed atypical viral pneumonia..   Echo (12/28/2018)  Normal left ventricular structure and size. Wall thickness is normal.  Systolic function is normal with an ejection fraction of 55-65%. Wall  motion is within normal limits. Unable to assess diastolic function. There  is no thrombus.  Complement C3, Serum 82 - 167 mg/dL 595        C4 Complement 12 - 38 mg/dL 19        ANA Direct Negative  Negative        RA Quant 0.0 - 13.9 IU/mL <10.0        Anti-DNA (DS) Ab Qn 0 - 9 IU/mL <1  Negative   <5                   Equivocal 5 - 9                   Positive   >9      Sjogren's Antibodies(SSA) 0.0 - 0.9 AI <0.2        Sjogren's Antibodies(SSB) 0.0 - 0.9 AI <0.2        RNP Antibodies 0.0 - 0.9 AI 0.4        Smith Antibodies 0.0 - 0.9 AI <0.2     Antimyeloperoxidase (MPO) Abs 0.0 - 9.0 U/mL <9.0        Antiproteinase 3 (PR-3) Abs 0.0 - 3.5 U/mL <3.5        C-ANCA Neg:<1:20 titer <1:20        P-ANCA Neg:<1:20 titer <1:20     Total IgG 586 - 1,602 mg/dL 812      IgA  87 - 751 mg/dL 700      IgM 26 - 174 mg/dL [id number]      Immunoglobulin E, Total 6 - 495 IU/mL 4Low     Dr Gretta Cool Reflux Symptom Index (> 13-15 suggestive of LPR cough) 0 -> 5  =  none ->severe problem  Hoarseness of problem with voice 1  Clearing  Of Throat 1  Excess throat mucus or feeling of post nasal drip 3  Difficulty swallowing food, liquid or tablets 3  Cough after eating or lying down 2  Breathing difficulties or choking episodes 1  Troublesome or annoying cough 4  Sensation of something sticking in throat or lump in throat 1  Heartburn, chest pain, indigestion, or stomach acid coming up 3  TOTAL 19   SYMPTOM SCALE - ILD   O2 use None  Shortness of Breath 0 -> 5 scale with 5 being worst (score 6 If unable to do)  At rest 0  Simple tasks - showers, clothes change, eating, shaving 0  Household (dishes, doing bed, laundry) 2  Shopping 1  Walking level at own pace 2  Walking up Stairs 3  Total (30-36) Dyspnea Score 8  How bad is your cough? 2  How bad is your fatigue 1  How bad is nausea 0  How bad is vomiting?  0  How bad is diarrhea? 1  How bad is anxiety? 0  How bad is depression 0   ROS - per HPI  xxxx    12/23/2019  - Visit   65 year old female never smoker followed in our office for chronic cough and post Covid syndrome.  She is established with Dr. Marchelle Gearing.  She was last seen on 11/29/2019.  It was recommended at that office visit that she obtain a high-resolution CT chest, obtain pulmonary function testing and continue to follow-up with ENT in [place], continue follow-up with Dr. Madie Reno for allergy asthma.  Patient presenting today after completing pulmonary function testing those results are listed below:  12/23/2019-pulmonary function test-FVC 2.65 (84% predicted), postbronchodilator ratio 88, postbronchodilator FEV1 2.48 (103% predicted), positive bronchodilator response in the FEV1, TLC 5.27 (107% predicted), DLCO 23.31 (120%  predicted)  Per consult note on 11/29/2019 patient was diagnosed with COVID-19 pneumonia in October/2020.  She was hospitalized.  Patient reporting today she never required a mechanical ventilator.  She did receive steroids.  She also received high flow nasal cannula as initial oxygenation when hospitalized was in the low 80s.  Patient is a [employer] representative.  She is also noticed that over the last year she has had ongoing difficulty with singing.  Prior to COVID-19 infection patient was very active walking between 5 to 10 miles a day.  She reports that she is unable to even walk closer to a mile at this point in time.  She still deals with ongoing fatigue.  She did not notice or remember a significant improvement in her symptoms when she was taking Qvar.  She takes Qvar seasonally as managed by Dr. Madie Reno her allergist.  She also takes Xyzal.  She has considerable amount of postnasal drip.  She reports a cough most evenings when laying flat.  Questionaires / Pulmonary Flowsheets:   ACT:  No flowsheet data found.  MMRC: No flowsheet data found.  Epworth:  No flowsheet data found.  Tests:   12/05/2019-CT chest high-res-faint subpleural groundglass in the left lower lobe is nonspecific but may reflect subtle post COVID-19 inflammatory fibrosis, otherwise no evidence of interstitial lung disease, mild air trapping is indicative of small airways disease, marginal irregularity in the liver is indicative of cirrhosis, aortic arthrosclerosis  12/23/2019-pulmonary function test-FVC 2.65 (84% predicted), postbronchodilator ratio 88, postbronchodilator FEV1 2.48 (103% predicted), positive bronchodilator response in the FEV1, TLC 5.27 (107% predicted), DLCO 23.31 (120% predicted)    OV 02/29/2020  Subjective:  Patient ID: Mary Orr, female , DOB: July 30, 1955 , age 13 y.o. , MRN: [id number] , ADDRESS: [address] [place] [address] PCP Eartha Inch, MD Patient Care  Team: Eartha Inch, MD as PCP - General (Family Medicine)  This Provider for this visit: Treatment Team:  Attending Provider: Kalman Shan, MD    02/29/2020 -   Chief Complaint  Patient presents with  . Follow-up    Doing better    Follow-up post COVID pneumonia October 2020 with admission  HPI Mary Orr 65 y.o. -last seen in November 2021 after that she followed up with nurse practitioner.  At that time subtle early ILD changes were noted on the CT scan of the chest.  She tells me that she has continued to improve in terms of her cough and shortness of breath.  She has an occasional light blue color changes to her lips.  At the time she checks oxygen saturation with the finger and this is normal.  She denies similar discoloration of her hands or fingers.  Denies that her hands turn cold.  Cough and shortness of breath improving.  Symptom score is listed below.  She is concerned about the ILD changes in the CT scan of the chest and inquired about it.  Overall she feels her course is one of significant improvement.  She is relieved that she is improving.    SYMPTOM SCALE - 02/29/2020   O2 use ra  Shortness of Breath 0 -> 5 scale with 5 being worst (score 6 If unable to do)  At rest 0  Simple tasks - showers, clothes change, eating, shaving 0  Household (dishes, doing bed, laundry) 1.5  Shopping 2  Walking level at own pace 2  Walking up Stairs 3.5  Total (30-36) Dyspnea Score 9  How bad is your cough? 0.5  How bad is your fatigue 0.5  How bad is nausea 0  How bad is vomiting?  00  How bad is diarrhea? 0  How bad is anxiety? 0  How bad is depression 0         PFT  PFT Results Latest Ref Rng &  Units 12/23/2019  FVC-Pre L 2.65  FVC-Predicted Pre % 84  FVC-Post L 2.83  FVC-Predicted Post % 90  Pre FEV1/FVC % % 83  Post FEV1/FCV % % 88  FEV1-Pre L 2.20  FEV1-Predicted Pre % 91  FEV1-Post L 2.48  DLCO uncorrected ml/min/mmHg 23.31  DLCO UNC% % 120   DLCO corrected ml/min/mmHg 23.31  DLCO COR %Predicted % 120  DLVA Predicted % 120  TLC L 5.27  TLC % Predicted % 107  RV % Predicted % 123       has a past medical history of Thyroid disease.   reports that she has never smoked. She has never used smokeless tobacco.  Past Surgical History:  Procedure Laterality Date  . BACK SURGERY    . NASAL SINUS SURGERY      No Known Allergies  Immunization History  Administered Date(s) Administered  . Influenza,inj,Quad PF,6+ Mos 10/28/2016, 10/29/2017, 12/23/2019  . Influenza,trivalent, recombinat, inj, PF 12/01/2011  . PFIZER(Purple Top)SARS-COV-2 Vaccination 05/03/2019, 05/24/2019  . Tdap 05/31/2015    History reviewed. No pertinent family history.   Current Outpatient Medications:  .  albuterol (VENTOLIN HFA) 108 (90 Base) MCG/ACT inhaler, Inhale into the lungs. , Disp: , Rfl:  .  aspirin 81 MG EC tablet, Take by mouth., Disp: , Rfl:  .  azelastine (ASTELIN) 0.1 % nasal spray, 1 spray., Disp: , Rfl:  .  beclomethasone (QVAR REDIHALER) 40 MCG/ACT inhaler, Inhale into the lungs., Disp: , Rfl:  .  budesonide-formoterol (SYMBICORT) 80-4.5 MCG/ACT inhaler, Inhale 2 puffs into the lungs in the morning and at bedtime. (Patient taking differently: Inhale 1 puff into the lungs in the morning and at bedtime.), Disp: 10.2 g, Rfl: 0 .  buPROPion (WELLBUTRIN XL) 150 MG 24 hr tablet, TAKE 1 TABLET(150 MG) BY MOUTH DAILY, Disp: , Rfl:  .  chlorpheniramine (CHLOR-TRIMETON) 4 MG tablet, chlorpheniramine -aka Chlor tabs - 4 mg tablet -1 to 2 tablets at night- for management of allergies and postnasal drip at night, Disp: 60 tablet, Rfl: 3 .  EPINEPHrine 0.3 mg/0.3 mL IJ SOAJ injection, Inject 0.3 mLs (0.3 mg total) into the muscle as needed for anaphylaxis., Disp: 1 each, Rfl: 0 .  Estradiol 10 MCG TABS vaginal tablet, Place vaginally., Disp: , Rfl:  .  levocetirizine (XYZAL) 5 MG tablet, Take by mouth., Disp: , Rfl:  .  levothyroxine (SYNTHROID)  75 MCG tablet, Take 75 mcg by mouth daily before breakfast., Disp: , Rfl:  .  Multiple Vitamin (MULTIVITAMIN) capsule, Take 1 capsule by mouth daily., Disp: , Rfl:  .  pantoprazole (PROTONIX) 20 MG tablet, Take 1 tablet (20 mg total) by mouth 2 (two) times daily before a meal., Disp: 60 tablet, Rfl: 3 .  Diclofenac Sodium CR 100 MG 24 hr tablet, Take by mouth. (Patient not taking: Reported on 02/29/2020), Disp: , Rfl:       Objective:   Vitals:   02/29/20 0919  BP: 114/70  Pulse: 89  Temp: (!) 97.2 F (36.2 C)  TempSrc: Oral  SpO2: 98%  Weight: 164 lb 3.2 oz (74.5 kg)  Height: 5\' 4"  (1.626 m)    Estimated body mass index is 28.18 kg/m as calculated from the following:   Height as of this encounter: 5\' 4"  (1.626 m).   Weight as of this encounter: 164 lb 3.2 oz (74.5 kg).  @WEIGHTCHANGE @    02/29/20 0919  Weight: 164 lb 3.2 oz (74.5 kg)     Physical Exam  General:  No distress. Looks well Neuro: Alert and Oriented x 3. GCS 15. Speech normal Psych: Pleasant Resp:  Barrel Chest - no.  Wheeze - no, Crackles - no, No overt respiratory distress CVS: Normal heart sounds. Murmurs - no Ext: Stigmata of Connective Tissue Disease - no HEENT: Normal upper airway. PEERL +. No post nasal drip        Assessment:       ICD-10-CM   1. Post-COVID syndrome  U09.9   2. Chronic cough  R05.3   3. Dyspnea on exertion  R06.00   4. Blue lips  R23.0        Plan:     Patient Instructions     ICD-10-CM   1. Post-COVID syndrome  U09.9   2. Chronic cough  R05.3   3. Dyspnea on exertion  R06.00   4. Blue lips  R23.0     Glad you are feeling better and you are doing better in terms of his symptoms I do not know what is causing the mild occasional blue lips at night or in the evening  Plan -Continue to monitor your symptoms and overall health status -Any change in your symptoms please let us know -Repeat high-resolution CT scan of the chest sometime in November 2022  [1 year follow-up]  Follow-up -Return to see Dr. Marchelle Gearingamaswamy in November 2022 after CT scan of the chest [15-minute slot]   ( Level 05 visit: Estb 40-54 min **  in  visit type: on-site physical face to visit  in total care time and counseling or/and coordination of care by this undersigned MD - Dr Kalman ShanMurali Nikkia Devoss. This includes one or more of the following on this same day 02/29/2020: pre-charting, chart review, note writing, documentation discussion of test results, diagnostic or treatment recommendations, prognosis, risks and benefits of management options, instructions, education, compliance or risk-factor reduction. It excludes time spent by the CMA or office staff in the care of the patient. Actual time 40 min)   SIGNATURE    Dr. Kalman ShanMurali Juliocesar Blasius, M.D., F.C.C.P,  Pulmonary and Critical Care Medicine Staff Physician, [facility] Director - Interstitial Lung Disease  Program  Pulmonary Fibrosis [facility] [place], [place], [id number]  Pager: [phone], If no answer or between  15:00h - 7:00h: call [phone] Telephone: [phone]  10:08 AM 02/29/2020

## 2020-02-29 NOTE — Patient Instructions (Addendum)
ICD-10-CM   1. Post-COVID syndrome  U09.9   2. Chronic cough  R05.3   3. Dyspnea on exertion  R06.00   4. Blue lips  R23.0     Glad you are feeling better and you are doing better in terms of his symptoms I do not know what is causing the mild occasional blue lips at night or in the evening  Plan -Continue to monitor your symptoms and overall health status -Any change in your symptoms please let us know -Repeat high-resolution CT scan of the chest sometime in November 2022 [1 year follow-up]  Follow-up -Return to see Dr. Marchelle Gearing in November 2022 after CT scan of the chest [15-minute slot]

## 2020-06-03 DIAGNOSIS — Z7982 Long term (current) use of aspirin: Secondary | ICD-10-CM | POA: Diagnosis not present

## 2020-06-03 DIAGNOSIS — R0789 Other chest pain: Secondary | ICD-10-CM | POA: Diagnosis not present

## 2020-06-03 DIAGNOSIS — Z79899 Other long term (current) drug therapy: Secondary | ICD-10-CM | POA: Diagnosis not present

## 2020-06-03 DIAGNOSIS — R6884 Jaw pain: Secondary | ICD-10-CM | POA: Diagnosis not present

## 2020-06-03 DIAGNOSIS — M542 Cervicalgia: Secondary | ICD-10-CM | POA: Diagnosis not present

## 2020-06-03 DIAGNOSIS — R079 Chest pain, unspecified: Secondary | ICD-10-CM | POA: Diagnosis not present

## 2020-06-03 DIAGNOSIS — R7989 Other specified abnormal findings of blood chemistry: Secondary | ICD-10-CM | POA: Diagnosis not present

## 2020-06-03 DIAGNOSIS — E079 Disorder of thyroid, unspecified: Secondary | ICD-10-CM | POA: Diagnosis not present

## 2020-06-03 DIAGNOSIS — J9811 Atelectasis: Secondary | ICD-10-CM | POA: Diagnosis not present

## 2020-06-11 DIAGNOSIS — M542 Cervicalgia: Secondary | ICD-10-CM | POA: Diagnosis not present

## 2020-06-11 DIAGNOSIS — M47812 Spondylosis without myelopathy or radiculopathy, cervical region: Secondary | ICD-10-CM | POA: Diagnosis not present

## 2020-06-11 DIAGNOSIS — M546 Pain in thoracic spine: Secondary | ICD-10-CM | POA: Diagnosis not present

## 2020-11-08 ENCOUNTER — Encounter: Payer: Self-pay | Admitting: Internal Medicine

## 2020-11-08 ENCOUNTER — Ambulatory Visit: Payer: BC Managed Care – PPO | Admitting: Internal Medicine

## 2020-11-08 ENCOUNTER — Other Ambulatory Visit: Payer: Self-pay

## 2020-11-08 VITALS — BP 124/72 | HR 71 | Temp 98.0°F | Ht 64.0 in | Wt 165.6 lb

## 2020-11-08 DIAGNOSIS — G9332 Myalgic encephalomyelitis/chronic fatigue syndrome: Secondary | ICD-10-CM

## 2020-11-08 DIAGNOSIS — U099 Post covid-19 condition, unspecified: Secondary | ICD-10-CM

## 2020-11-08 DIAGNOSIS — R0609 Other forms of dyspnea: Secondary | ICD-10-CM

## 2020-11-08 DIAGNOSIS — Z8709 Personal history of other diseases of the respiratory system: Secondary | ICD-10-CM

## 2020-11-08 MED ORDER — FLUTICASONE FUROATE-VILANTEROL 100-25 MCG/INH IN AEPB: 1.0000 | INHALATION_SPRAY | Freq: Every day | RESPIRATORY_TRACT | 0 refills | Status: AC

## 2020-11-08 MED ORDER — FLUTICASONE FUROATE-VILANTEROL 100-25 MCG/INH IN AEPB: 1.0000 | INHALATION_SPRAY | Freq: Every day | RESPIRATORY_TRACT | 0 refills | Status: DC

## 2020-11-08 NOTE — Progress Notes (Signed)
OV 11/29/2019  Subjective:  Patient ID: Mary Orr, female , DOB: [birth year] , age 65 y.o. , MRN: [id number] , ADDRESS: [address] [place] [address] PCP Eartha Inch, MD Patient Care Team: Eartha Inch, MD as PCP - General (Family Medicine)  This Provider for this visit: Treatment Team:  Attending Provider: Kalman Shan, MD  11/29/2019 -   Chief Complaint  Patient presents with   Consult    Post-covid, long-hauler cough, Covid las   HPI Mary Orr 65 y.o. - with a PMHx of allergies following by Dr. Irena Cords, COVID-19 pneumonia in October 2020 with post-COVID-19 syndrome, hypothyroidism, GERD who is here today for evaluation of chronic cough. [name] states that she first contracted COVID-19 in October 23020 and was hospitalized for a short period of time. After discharge, she continued to experience significant fatigue with excessive sleepiness that lasted up till 1.5 months ago. During this time, she was sleeping approximately 10-12 hours per day. She also experienced both dry and productive cough that is worst in the morning but occurs at night, at rest and with exertion throughout the day. The cough has improved somewhat but has still persisted. It is not necessarily improved with rest or drinking sips of water. [name] states that her family has mentioned she is coughing in the night-time but she does not always wake for it. In the past year, [name] has also experienced persistent SOB that is worsened with exertion. For these symptoms, she has followed up with Dr. Jerre Simon in [place]. At the time, she underwent PFTs and lab work (please see below), that was largely negative. [name] states she was told her symptoms may be related to underlying sleep disorder and recommendation was for sleep studies, however she has decided not to complete these after following up with her PCP. Overall, her fatigue and GI symptoms from COVID-19 improved  the most, however her cough and SOB have persisted.   In the past 2 weeks, [name] states she has also experienced a viral URI that affected her entire family. She has worsening in her cough and SOB during this time. Her PCP prescribed her Amoxicillin and Prednisone; she completed the Amoxicillin course 4 days, however did not begin the Prednisone pack. She has begun to feel better at this time.   She denies any previous history of lung disease other than asthma. No family history of ILD or rheumatological disorders.   PFTs (04/27/2019): Read by Dr. Jerre Simon as normal on [facility]. Unable to obtain full results.   CTA (10/29/2018) IMPRESSION:  1.  No pulmonary embolism.  2.  No aortic aneurysm or dissection.  3.  Peripheral areas of interstitial thickening/scarring bilaterally. Multifocal groundglass opacity likely represent superimposed atypical viral pneumonia..   Echo (12/28/2018)  Normal left ventricular structure and size. Wall thickness is normal.  Systolic function is normal with an ejection fraction of 55-65%. Wall  motion is within normal limits. Unable to assess diastolic function. There  is no thrombus.  Complement C3, Serum 82 - 167 mg/dL 644        C4 Complement 12 - 38 mg/dL 19        ANA Direct Negative  Negative        RA Quant 0.0 - 13.9 IU/mL <10.0        Anti-DNA (DS) Ab Qn 0 - 9 IU/mL <1  Negative      <5                                     Equivocal  5 - 9                                     Positive      >9      Sjogren's Antibodies(SSA) 0.0 - 0.9 AI <0.2        Sjogren's Antibodies(SSB) 0.0 - 0.9 AI <0.2        RNP Antibodies 0.0 - 0.9 AI 0.4        Smith Antibodies 0.0 - 0.9 AI <0.2     Antimyeloperoxidase (MPO) Abs 0.0 - 9.0 U/mL <9.0        Antiproteinase 3 (PR-3) Abs 0.0 - 3.5 U/mL <3.5        C-ANCA Neg:<1:20 titer <1:20        P-ANCA Neg:<1:20 titer <1:20     Total IgG 586 - 1,602 mg/dL 132      IgA  87 - 440 mg/dL 102      IgM 26 - 725 mg/dL 366 High       Immunoglobulin E, Total 6 - 495 IU/mL 4 Low      Dr Gretta Cool Reflux Symptom Index (> 13-15 suggestive of LPR cough) 0 -> 5  =  none ->severe problem  Hoarseness of problem with voice 1  Clearing  Of Throat 1  Excess throat mucus or feeling of post nasal drip 3  Difficulty swallowing food, liquid or tablets 3  Cough after eating or lying down 2  Breathing difficulties or choking episodes 1  Troublesome or annoying cough 4  Sensation of something sticking in throat or lump in throat 1  Heartburn, chest pain, indigestion, or stomach acid coming up 3  TOTAL 19   SYMPTOM SCALE - ILD   O2 use None  Shortness of Breath 0 -> 5 scale with 5 being worst (score 6 If unable to do)  At rest 0  Simple tasks - showers, clothes change, eating, shaving 0  Household (dishes, doing bed, laundry) 2  Shopping 1  Walking level at own pace 2  Walking up Stairs 3  Total (30-36) Dyspnea Score 8  How bad is your cough? 2  How bad is your fatigue 1  How bad is nausea 0  How bad is vomiting?  0  How bad is diarrhea? 1  How bad is anxiety? 0  How bad is depression 0   ROS - per HPI  xxxx    12/23/2019  - Visit   65 year old female never smoker followed in our office for chronic cough and post Covid syndrome.  She is established with Dr. Marchelle Gearing.  She was last seen on 11/29/2019.  It was recommended at that office visit that she obtain a high-resolution CT chest, obtain pulmonary function testing and continue to follow-up with ENT in [place], continue follow-up with Dr. Madie Reno for allergy asthma.  Patient presenting today after completing pulmonary function testing those results are listed below:  12/23/2019-pulmonary function test-FVC 2.65 (84% predicted), postbronchodilator ratio 88, postbronchodilator FEV1 2.48 (103% predicted), positive bronchodilator response in the FEV1, TLC 5.27 (107% predicted), DLCO 23.31 (120%  predicted)  Per consult note on 11/29/2019 patient was diagnosed with  COVID-19 pneumonia in October/2020.  She was hospitalized.  Patient reporting today she never required a mechanical ventilator.  She did receive steroids.  She also received high flow nasal cannula as initial oxygenation when hospitalized was in the low 80s.  Patient is a [employer].  She is also noticed that over the last year she has had ongoing difficulty with singing.  Prior to COVID-19 infection patient was very active walking between 5 to 10 miles a day.  She reports that she is unable to even walk closer to a mile at this point in time.  She still deals with ongoing fatigue.  She did not notice or remember a significant improvement in her symptoms when she was taking Qvar.  She takes Qvar seasonally as managed by Dr. Madie Reno her allergist.  She also takes Xyzal.  She has considerable amount of postnasal drip.  She reports a cough most evenings when laying flat.  Questionaires / Pulmonary Flowsheets:   ACT:  No flowsheet data found.  MMRC: No flowsheet data found.  Epworth:  No flowsheet data found.  Tests:   12/05/2019-CT chest high-res-faint subpleural groundglass in the left lower lobe is nonspecific but may reflect subtle post COVID-19 inflammatory fibrosis, otherwise no evidence of interstitial lung disease, mild air trapping is indicative of small airways disease, marginal irregularity in the liver is indicative of cirrhosis, aortic arthrosclerosis  12/23/2019-pulmonary function test-FVC 2.65 (84% predicted), postbronchodilator ratio 88, postbronchodilator FEV1 2.48 (103% predicted), positive bronchodilator response in the FEV1, TLC 5.27 (107% predicted), DLCO 23.31 (120% predicted)    OV 02/29/2020  Subjective:  Patient ID: Mary Orr, female , DOB: November 15, 1955 , age 37 y.o. , MRN: [id number] , ADDRESS: [address] [place] [address] PCP Eartha Inch, MD Patient Care  Team: Eartha Inch, MD as PCP - General (Family Medicine)  This Provider for this visit: Treatment Team:  Attending Provider: Kalman Shan, MD    02/29/2020 -   Chief Complaint  Patient presents with   Follow-up    Doing better    Follow-up post COVID pneumonia October 2020 with admission  HPI Mary Orr 65 y.o. -last seen in November 2021 after that she followed up with nurse practitioner.  At that time subtle early ILD changes were noted on the CT scan of the chest.  She tells me that she has continued to improve in terms of her cough and shortness of breath.  She has an occasional light blue color changes to her lips.  At the time she checks oxygen saturation with the finger and this is normal.  She denies similar discoloration of her hands or fingers.  Denies that her hands turn cold.  Cough and shortness of breath improving.  Symptom score is listed below.  She is concerned about the ILD changes in the CT scan of the chest and inquired about it.  Overall she feels her course is one of significant improvement.  She is relieved that she is improving.       OV 11/08/2020  Subjective:  Patient ID: Mary Orr, female , DOB: 01-28-1955 , age 33 y.o. , MRN: [id number] , ADDRESS: [address] [place] [address] PCP Eartha Inch, MD Patient Care Team: Eartha Inch, MD as PCP - General (Family Medicine)  This Provider for this visit: Treatment Team:  Attending Provider: Kalman Shan, MD    11/08/2020 -   Chief Complaint  Patient presents with   Follow-up  Pt states she has been doing better since last visit. States her cough is also better.   Follow-up post-COVID pneumonia admission October 2020   HPI Mary Orr 65 y.o. -is now 2 years since she got admitted.  She has been following with [place] come 1 years this November 2022.  She tells me that overall 6 dyspnea on exertion might be some better but she is not sure.  The main issue  she tells me is that 2 months prior to her COVID in October 2020 she was diagnosed with allergic asthma and given Qvar.  Then she got COVID with hospitalization in October 2020.  This required oxygen therapy.  Then she spent much of 2021 with a cough.  When she saw Elisha Headland the nurse practitioner [phone] he gave her Symbicort.  This seemed to help but in the year 2022 she stopped in the spring 2022 after she saw me in February 2022.  That she got a viral infection that required prednisone and antibiotics.  Then she went back on Symbicort and then stopped it again and then by summer 2022 had another bad respiratory infection that required antibiotic and prednisone again.  She says these episodes were severe.  Since July 2022 she has not been on any Symbicort.  No inhaled steroids.  She is asking if she should be taking Symbicort again.  She says 2 weeks ago she saw Dr. Madie Reno and has been recommended to stay on Symbicort.  She says she is only realizing now that is a maintenance inhaler.  She does seem to think that during Symbicort she was not getting respiratory infections.  She somewhat feels that the absence of Symbicort is making her prone to respiratory infections.  She still has fixed dyspnea on exertion.  She thinks is some improved compared to a year ago but nowhere near pre-COVID.  In fact she tells me that pre-COVID she used to walk 5 miles but now she is barely able to make a mile.   She is not sure if the Symbicort earlier in the year was actually helping her fixed dyspnea.  Current symptom score is listed below and clearly shows that she only might be marginally better compared to a year ago  In addition she is reporting episodes of exhaustion.  These happen randomly.  During this time all her symptoms get accentuated.  This characterized by worsening of the same symptoms as below..    Scymbicort $300 - too expensive. Wants alternative   SYMPTOM SCALE - Nov 2021 02/29/2020   11/08/2020 abaseline 11/08/2020 During exhaustion episodes  O2 use  ra    Shortness of Breath  0 -> 5 scale with 5 being worst (score 6 If unable to do)    At rest 0 0 1 2.5  Simple tasks - showers, clothes change, eating, shaving 0 0 1 2  Household (dishes, doing bed, laundry) 2 1.5 1.5 3  Shopping 1 2 1 3   Walking level at own pace 2 2 1.5 4  Walking up Stairs 3 3.5 1 3   Total (30-36) Dyspnea Score 8 9 7  17.5  How bad is your cough? 2 0.5 0. Bad during episodes  How bad is your fatigue 1 0.5 1 4   How bad is nausea  0 0 0  How bad is vomiting?   00 0 0  How bad is diarrhea?  0 0 0  How bad is anxiety?  0 0 0  How bad is depression  0 0 00     PFT  PFT Results Latest Ref Rng & Units 12/23/2019  FVC-Pre L 2.65  FVC-Predicted Pre % 84  FVC-Post L 2.83  FVC-Predicted Post % 90  Pre FEV1/FVC % % 83  Post FEV1/FCV % % 88  FEV1-Pre L 2.20  FEV1-Predicted Pre % 91  FEV1-Post L 2.48  DLCO uncorrected ml/min/mmHg 23.31  DLCO UNC% % 120  DLCO corrected ml/min/mmHg 23.31  DLCO COR %Predicted % 120  DLVA Predicted % 120  TLC L 5.27  TLC % Predicted % 107  RV % Predicted % 123       has a past medical history of Thyroid disease.   reports that she has never smoked. She has never used smokeless tobacco.  Past Surgical History:  Procedure Laterality Date   BACK SURGERY     NASAL SINUS SURGERY      No Known Allergies  Immunization History  Administered Date(s) Administered   Influenza,inj,Quad PF,6+ Mos 10/28/2016, 10/29/2017, 12/23/2019   Influenza,trivalent, recombinat, inj, PF 12/01/2011   PFIZER(Purple Top)SARS-COV-2 Vaccination 05/03/2019, 05/24/2019   Tdap 05/31/2015    No family history on file.   Current Outpatient Medications:    albuterol (VENTOLIN HFA) 108 (90 Base) MCG/ACT inhaler, Inhale 2 puffs into the lungs every 6 (six) hours as needed., Disp: , Rfl:    aspirin 81 MG EC tablet, Take by mouth., Disp: , Rfl:    azelastine (ASTELIN) 0.1 % nasal  spray, 1 spray., Disp: , Rfl:    buPROPion (WELLBUTRIN XL) 150 MG 24 hr tablet, TAKE 1 TABLET(150 MG) BY MOUTH DAILY, Disp: , Rfl:    chlorpheniramine (CHLOR-TRIMETON) 4 MG tablet, chlorpheniramine -aka Chlor tabs - 4 mg tablet -1 to 2 tablets at night- for management of allergies and postnasal drip at night, Disp: 60 tablet, Rfl: 3   Diclofenac Sodium CR 100 MG 24 hr tablet, Take by mouth., Disp: , Rfl:    Estradiol 10 MCG TABS vaginal tablet, Place vaginally., Disp: , Rfl:    fluticasone furoate-vilanterol (BREO ELLIPTA) 100-25 MCG/INH AEPB, Inhale 1 puff into the lungs daily., Disp: 14 each, Rfl: 0   levocetirizine (XYZAL) 5 MG tablet, Take by mouth., Disp: , Rfl:    levothyroxine (SYNTHROID) 50 MCG tablet, Take by mouth., Disp: , Rfl:    Multiple Vitamin (MULTIVITAMIN) capsule, Take 1 capsule by mouth daily., Disp: , Rfl:    pantoprazole (PROTONIX) 20 MG tablet, Take 1 tablet (20 mg total) by mouth 2 (two) times daily before a meal., Disp: 60 tablet, Rfl: 3   EPINEPHrine 0.3 mg/0.3 mL IJ SOAJ injection, Inject 0.3 mLs (0.3 mg total) into the muscle as needed for anaphylaxis. (Patient not taking: Reported on 11/08/2020), Disp: 1 each, Rfl: 0      Objective:   Vitals:   11/08/20 1044  BP: 124/72  Pulse: 71  Temp: 98 F (36.7 C)  TempSrc: Oral  SpO2: 97%  Weight: 165 lb 9.6 oz (75.1 kg)  Height: 5\' 4"  (1.626 m)    Estimated body mass index is 28.43 kg/m as calculated from the following:   Height as of this encounter: 5\' 4"  (1.626 m).   Weight as of this encounter: 165 lb 9.6 oz (75.1 kg).  @WEIGHTCHANGE @  [employer]   11/08/20 1044  Weight: 165 lb 9.6 oz (75.1 kg)     Physical Exam  General: No distress. Loos well Neuro: Alert and Oriented x 3. GCS 15. Speech normal Psych: Pleasant Resp:  Barrel Chest -  no.  Wheeze - no, Crackles - no, No overt respiratory distress CVS: Normal heart sounds. Murmurs - no Ext: Stigmata of Connective Tissue Disease - no HEENT: Normal  upper airway. PEERL +. No post nasal drip        Assessment:       ICD-10-CM   1. Dyspnea on exertion  R06.09     2. Post-COVID syndrome  U09.9     3. COVID-19 long hauler manifesting chronic fatigue  G93.32    U09.9     4. COVID-19 long hauler manifesting chronic dyspnea  R06.09    U09.9     5. History of asthma  Z87.09          Plan:     Patient Instructions     ICD-10-CM   1. Dyspnea on exertion  R06.09     2. Post-COVID syndrome  U09.9     3. COVID-19 long hauler manifesting chronic fatigue  G93.32    U09.9     4. COVID-19 long hauler manifesting chronic dyspnea  R06.09    U09.9     5. History of asthma  Z87.09       You are having symptoms of post COVID long-haul that can partially explain your shortness of breath and episodes of fatigue  But it could also be that your asthma is not fully controlled  Plan  - Continue Symbicort [employer] and ask for a cheaper alternative] till then do Breo 100 strength 1 puff once daily [take 2 samples] - Get high-resolution CT chest supine and prone in 1 month - Do cardiopulmonary stress test with exercise induced bronchospasm challenge on a bike in 1 month [at this point in time he should be on he also inhaled steroid for at least few weeks]  Follow-up - Return in 4 to 6 weeks to see Dr. Marchelle Gearing nurse practitioner to discuss the results  -Consider ehaled nitric oxide evaluation at follow-up   ( Level 05 visit: Estb 40-54 min   in  visit type: on-site physical face to visit  in total care time and counseling or/and coordination of care by this undersigned MD - Dr Kalman Shan. This includes one or more of the following on this same day 11/08/2020: pre-charting, chart review, note writing, documentation discussion of test results, diagnostic or treatment recommendations, prognosis, risks and benefits of management options, instructions, education, compliance or risk-factor reduction. It excludes time  spent by the CMA or office staff in the care of the patient. Actual time 42 min)  SIGNATURE    Dr. Kalman Shan, M.D., F.C.C.P,  Pulmonary and Critical Care Medicine Staff Physician, [facility] Director - Interstitial Lung Disease  Program  Pulmonary Fibrosis [facility] at [facility] [place], [place], [address]  Pager: [phone], If no answer or between  15:00h - 7:00h: call [phone] Telephone: [phone]  11:40 AM 11/08/2020

## 2020-11-08 NOTE — Patient Instructions (Addendum)
ICD-10-CM   1. Dyspnea on exertion  R06.09     2. Post-COVID syndrome  U09.9     3. COVID-19 long hauler manifesting chronic fatigue  G93.32    U09.9     4. COVID-19 long hauler manifesting chronic dyspnea  R06.09    U09.9     5. History of asthma  Z87.09       You are having symptoms of post COVID long-haul that can partially explain your shortness of breath and episodes of fatigue  But it could also be that your asthma is not fully controlled  Plan  - Continue Symbicort [employer] and ask for a cheaper alternative] till then do Breo 100 strength 1 puff once daily [take 2 samples] - Get high-resolution CT chest supine and prone in 1 month - Do cardiopulmonary stress test with exercise induced bronchospasm challenge on a bike in 1 month [at this point in time he should be on he also inhaled steroid for at least few weeks]  Follow-up - Return in 4 to 6 weeks to see Dr. Marchelle Gearing nurse practitioner to discuss the results  -Consider ehaled she just told me while spent 45 minutes with can believe 1 month nitric oxide evaluation at follow-up

## 2020-11-15 ENCOUNTER — Encounter (HOSPITAL_COMMUNITY): Payer: BC Managed Care – PPO

## 2020-11-27 ENCOUNTER — Other Ambulatory Visit: Payer: Self-pay

## 2020-11-27 ENCOUNTER — Ambulatory Visit (HOSPITAL_COMMUNITY): Payer: BC Managed Care – PPO | Attending: Internal Medicine

## 2020-11-27 DIAGNOSIS — Z79890 Hormone replacement therapy (postmenopausal): Secondary | ICD-10-CM | POA: Insufficient documentation

## 2020-11-27 DIAGNOSIS — Z79899 Other long term (current) drug therapy: Secondary | ICD-10-CM | POA: Diagnosis not present

## 2020-11-27 DIAGNOSIS — R5383 Other fatigue: Secondary | ICD-10-CM | POA: Diagnosis present

## 2020-11-27 DIAGNOSIS — E079 Disorder of thyroid, unspecified: Secondary | ICD-10-CM | POA: Diagnosis not present

## 2020-11-27 DIAGNOSIS — Z7982 Long term (current) use of aspirin: Secondary | ICD-10-CM | POA: Insufficient documentation

## 2020-11-27 DIAGNOSIS — R0609 Other forms of dyspnea: Secondary | ICD-10-CM

## 2020-11-27 DIAGNOSIS — Z7951 Long term (current) use of inhaled steroids: Secondary | ICD-10-CM | POA: Diagnosis not present

## 2020-12-11 ENCOUNTER — Ambulatory Visit (INDEPENDENT_AMBULATORY_CARE_PROVIDER_SITE_OTHER): Payer: BC Managed Care – PPO

## 2020-12-11 ENCOUNTER — Other Ambulatory Visit: Payer: Self-pay

## 2020-12-11 DIAGNOSIS — R053 Chronic cough: Secondary | ICD-10-CM | POA: Diagnosis not present

## 2020-12-11 DIAGNOSIS — R0609 Other forms of dyspnea: Secondary | ICD-10-CM | POA: Diagnosis not present

## 2020-12-11 DIAGNOSIS — U099 Post covid-19 condition, unspecified: Secondary | ICD-10-CM

## 2020-12-13 ENCOUNTER — Ambulatory Visit: Payer: BC Managed Care – PPO | Admitting: Internal Medicine

## 2020-12-18 ENCOUNTER — Ambulatory Visit: Payer: BC Managed Care – PPO | Admitting: Pulmonary Disease

## 2021-01-03 ENCOUNTER — Other Ambulatory Visit: Payer: Self-pay

## 2021-01-03 ENCOUNTER — Encounter: Payer: Self-pay | Admitting: Internal Medicine

## 2021-01-03 ENCOUNTER — Ambulatory Visit (INDEPENDENT_AMBULATORY_CARE_PROVIDER_SITE_OTHER): Payer: BC Managed Care – PPO | Admitting: Internal Medicine

## 2021-01-03 VITALS — BP 122/82 | HR 83 | Ht 63.0 in | Wt 170.2 lb

## 2021-01-03 DIAGNOSIS — R0609 Other forms of dyspnea: Secondary | ICD-10-CM | POA: Diagnosis not present

## 2021-01-03 DIAGNOSIS — G9332 Myalgic encephalomyelitis/chronic fatigue syndrome: Secondary | ICD-10-CM | POA: Diagnosis not present

## 2021-01-03 DIAGNOSIS — U099 Post covid-19 condition, unspecified: Secondary | ICD-10-CM

## 2021-01-03 LAB — POCT EXHALED NITRIC OXIDE: FeNO level (ppb): 27

## 2021-01-03 NOTE — Patient Instructions (Addendum)
ICD-10-CM   1. Dyspnea on exertion  R06.09     2. Post-COVID syndrome  U09.9     3. COVID-19 long hauler manifesting chronic fatigue  G93.32    U09.9     4. COVID-19 long hauler manifesting chronic dyspnea  R06.09    U09.9     5. History of asthma  Z87.09       You are having symptoms of post COVID long-haul that can partially explain your shortness of breath and episodes of fatigue  Weight and loss of physical fitness also reason for shortness of breath  ASthma might be or  not be an issue  Plan  - If you want to hold off breo that is fine  - refer pulmonary rehab at [facility] or [facility] - encourage weight loss - goal weight around 145# (currently 170#)  - do this via PCP Eartha Inch, MD  - continue albuterol and mist as needed and other medicines scheduled  Follow-up - Return in 4 to 5 months or sooner if needed

## 2021-01-03 NOTE — Progress Notes (Signed)
OV 11/29/2019  Subjective:  Patient ID: Mary Orr, female , DOB: [birth year] , age 65 y.o. , MRN: [id number] , ADDRESS: [address] [place] [address] PCP Eartha Inch, MD Patient Care Team: Eartha Inch, MD as PCP - General (Family Medicine)  This Provider for this visit: Treatment Team:  Attending Provider: Kalman Shan, MD  11/29/2019 -   Chief Complaint  Patient presents with   Consult    Post-covid, long-hauler cough, Covid las   HPI Mary Orr 65 y.o. - with a PMHx of allergies following by Dr. Irena Cords, COVID-19 pneumonia in October 2020 with post-COVID-19 syndrome, hypothyroidism, GERD who is here today for evaluation of chronic cough. Mary Orr states that she first contracted COVID-19 in October 23020 and was hospitalized for a short period of time. After discharge, she continued to experience significant fatigue with excessive sleepiness that lasted up till 1.5 months ago. During this time, she was sleeping approximately 10-12 hours per day. She also experienced both dry and productive cough that is worst in the morning but occurs at night, at rest and with exertion throughout the day. The cough has improved somewhat but has still persisted. It is not necessarily improved with rest or drinking sips of water. Mary Orr states that her family has mentioned she is coughing in the night-time but she does not always wake for it. In the past year, Mary Orr has also experienced persistent SOB that is worsened with exertion. For these symptoms, she has followed up with Dr. Jerre Simon in [place]. At the time, she underwent PFTs and lab work (please see below), that was largely negative. Mary Orr states she was told her symptoms may be related to underlying sleep disorder and recommendation was for sleep studies, however she has decided not to complete these after following up with her PCP. Overall, her fatigue and GI symptoms from COVID-19 improved  the most, however her cough and SOB have persisted.   In the past 2 weeks, Mary Orr states she has also experienced a viral URI that affected her entire family. She has worsening in her cough and SOB during this time. Her PCP prescribed her Amoxicillin and Prednisone; she completed the Amoxicillin course 4 days, however did not begin the Prednisone pack. She has begun to feel better at this time.   She denies any previous history of lung disease other than asthma. No family history of ILD or rheumatological disorders.   PFTs (04/27/2019): Read by Dr. Jerre Simon as normal on [facility]. Unable to obtain full results.   CTA (10/29/2018) IMPRESSION:  1.  No pulmonary embolism.  2.  No aortic aneurysm or dissection.  3.  Peripheral areas of interstitial thickening/scarring bilaterally. Multifocal groundglass opacity likely represent superimposed atypical viral pneumonia..   Echo (12/28/2018)  Normal left ventricular structure and size. Wall thickness is normal.  Systolic function is normal with an ejection fraction of 55-65%. Wall  motion is within normal limits. Unable to assess diastolic function. There  is no thrombus.  Complement C3, Serum 82 - 167 mg/dL 644        C4 Complement 12 - 38 mg/dL 19        ANA Direct Negative  Negative        RA Quant 0.0 - 13.9 IU/mL <10.0        Anti-DNA (DS) Ab Qn 0 - 9 IU/mL <1  Negative      <5                                     Equivocal  5 - 9                                     Positive      >9      Sjogren's Antibodies(SSA) 0.0 - 0.9 AI <0.2        Sjogren's Antibodies(SSB) 0.0 - 0.9 AI <0.2        RNP Antibodies 0.0 - 0.9 AI 0.4        Smith Antibodies 0.0 - 0.9 AI <0.2     Antimyeloperoxidase (MPO) Abs 0.0 - 9.0 U/mL <9.0        Antiproteinase 3 (PR-3) Abs 0.0 - 3.5 U/mL <3.5        C-ANCA Neg:<1:20 titer <1:20        P-ANCA Neg:<1:20 titer <1:20     Total IgG 586 - 1,602 mg/dL 132      IgA  87 - 440 mg/dL 102      IgM 26 - 725 mg/dL 366 High       Immunoglobulin E, Total 6 - 495 IU/mL 4 Low      Dr Gretta Cool Reflux Symptom Index (> 13-15 suggestive of LPR cough) 0 -> 5  =  none ->severe problem  Hoarseness of problem with voice 1  Clearing  Of Throat 1  Excess throat mucus or feeling of post nasal drip 3  Difficulty swallowing food, liquid or tablets 3  Cough after eating or lying down 2  Breathing difficulties or choking episodes 1  Troublesome or annoying cough 4  Sensation of something sticking in throat or lump in throat 1  Heartburn, chest pain, indigestion, or stomach acid coming up 3  TOTAL 19   SYMPTOM SCALE - ILD   O2 use None  Shortness of Breath 0 -> 5 scale with 5 being worst (score 6 If unable to do)  At rest 0  Simple tasks - showers, clothes change, eating, shaving 0  Household (dishes, doing bed, laundry) 2  Shopping 1  Walking level at own pace 2  Walking up Stairs 3  Total (30-36) Dyspnea Score 8  How bad is your cough? 2  How bad is your fatigue 1  How bad is nausea 0  How bad is vomiting?  0  How bad is diarrhea? 1  How bad is anxiety? 0  How bad is depression 0   ROS - per HPI  xxxx    12/23/2019  - Visit   65 year old female never smoker followed in our office for chronic cough and post Covid syndrome.  She is established with Dr. Marchelle Gearing.  She was last seen on 11/29/2019.  It was recommended at that office visit that she obtain a high-resolution CT chest, obtain pulmonary function testing and continue to follow-up with ENT in [place], continue follow-up with Dr. Madie Reno for allergy asthma.  Patient presenting today after completing pulmonary function testing those results are listed below:  12/23/2019-pulmonary function test-FVC 2.65 (84% predicted), postbronchodilator ratio 88, postbronchodilator FEV1 2.48 (103% predicted), positive bronchodilator response in the FEV1, TLC 5.27 (107% predicted), DLCO 23.31 (120%  predicted)  Per consult note on 11/29/2019 patient was diagnosed with  COVID-19 pneumonia in October/2020.  She was hospitalized.  Patient reporting today she never required a mechanical ventilator.  She did receive steroids.  She also received high flow nasal cannula as initial oxygenation when hospitalized was in the low 80s.  Patient is a [employer] representative.  She is also noticed that over the last year she has had ongoing difficulty with singing.  Prior to COVID-19 infection patient was very active walking between 5 to 10 miles a day.  She reports that she is unable to even walk closer to a mile at this point in time.  She still deals with ongoing fatigue.  She did not notice or remember a significant improvement in her symptoms when she was taking Qvar.  She takes Qvar seasonally as managed by Dr. Madie Reno her allergist.  She also takes Xyzal.  She has considerable amount of postnasal drip.  She reports a cough most evenings when laying flat.  Questionaires / Pulmonary Flowsheets:   ACT:  No flowsheet data found.  MMRC: No flowsheet data found.  Epworth:  No flowsheet data found.  Tests:   12/05/2019-CT chest high-res-faint subpleural groundglass in the left lower lobe is nonspecific but may reflect subtle post COVID-19 inflammatory fibrosis, otherwise no evidence of interstitial lung disease, mild air trapping is indicative of small airways disease, marginal irregularity in the liver is indicative of cirrhosis, aortic arthrosclerosis  12/23/2019-pulmonary function test-FVC 2.65 (84% predicted), postbronchodilator ratio 88, postbronchodilator FEV1 2.48 (103% predicted), positive bronchodilator response in the FEV1, TLC 5.27 (107% predicted), DLCO 23.31 (120% predicted)    OV 02/29/2020  Subjective:  Patient ID: Mary Orr, female , DOB: November 15, 1955 , age 37 y.o. , MRN: [id number] , ADDRESS: [address] [place] [address] PCP Eartha Inch, MD Patient Care  Team: Eartha Inch, MD as PCP - General (Family Medicine)  This Provider for this visit: Treatment Team:  Attending Provider: Kalman Shan, MD    02/29/2020 -   Chief Complaint  Patient presents with   Follow-up    Doing better    Follow-up post COVID pneumonia October 2020 with admission  HPI RHILEY TARVER 65 y.o. -last seen in November 2021 after that she followed up with nurse practitioner.  At that time subtle early ILD changes were noted on the CT scan of the chest.  She tells me that she has continued to improve in terms of her cough and shortness of breath.  She has an occasional light blue color changes to her lips.  At the time she checks oxygen saturation with the finger and this is normal.  She denies similar discoloration of her hands or fingers.  Denies that her hands turn cold.  Cough and shortness of breath improving.  Symptom score is listed below.  She is concerned about the ILD changes in the CT scan of the chest and inquired about it.  Overall she feels her course is one of significant improvement.  She is relieved that she is improving.       OV 11/08/2020  Subjective:  Patient ID: Mary Orr, female , DOB: 01-28-1955 , age 33 y.o. , MRN: [id number] , ADDRESS: [address] [place] [address] PCP Eartha Inch, MD Patient Care Team: Eartha Inch, MD as PCP - General (Family Medicine)  This Provider for this visit: Treatment Team:  Attending Provider: Kalman Shan, MD    11/08/2020 -   Chief Complaint  Patient presents with   Follow-up  Pt states she has been doing better since last visit. States her cough is also better.   Follow-up post-COVID pneumonia admission October 2020   HPI MICAYLA BRATHWAITE 65 y.o. -is now 2 years since she got admitted.  She has been following with [place] come 1 years this November 2022.  She tells me that overall 6 dyspnea on exertion might be some better but she is not sure.  The main issue  she tells me is that 2 months prior to her COVID in October 2020 she was diagnosed with allergic asthma and given Qvar.  Then she got COVID with hospitalization in October 2020.  This required oxygen therapy.  Then she spent much of 2021 with a cough.  When she saw Elisha Headland the nurse practitioner [phone] he gave her Symbicort.  This seemed to help but in the year 2022 she stopped in the spring 2022 after she saw me in February 2022.  That she got a viral infection that required prednisone and antibiotics.  Then she went back on Symbicort and then stopped it again and then by summer 2022 had another bad respiratory infection that required antibiotic and prednisone again.  She says these episodes were severe.  Since July 2022 she has not been on any Symbicort.  No inhaled steroids.  She is asking if she should be taking Symbicort again.  She says 2 weeks ago she saw Dr. Madie Reno and has been recommended to stay on Symbicort.  She says she is only realizing now that is a maintenance inhaler.  She does seem to think that during Symbicort she was not getting respiratory infections.  She somewhat feels that the absence of Symbicort is making her prone to respiratory infections.  She still has fixed dyspnea on exertion.  She thinks is some improved compared to a year ago but nowhere near pre-COVID.  In fact she tells me that pre-COVID she used to walk 5 miles but now she is barely able to make a mile.   She is not sure if the Symbicort earlier in the year was actually helping her fixed dyspnea.  Current symptom score is listed below and clearly shows that she only might be marginally better compared to a year ago  In addition she is reporting episodes of exhaustion.  These happen randomly.  During this time all her symptoms get accentuated.  This characterized by worsening of the same symptoms as below..    Scymbicort $300 - too expensive. Wants alternative  PFT  OV 01/04/2021  Subjective:  Patient ID: Mary Orr, female , DOB: 1955-12-14 , age 76 y.o. , MRN: [id number] , ADDRESS: [address] [place] [address] PCP Eartha Inch, MD Patient Care Team: Eartha Inch, MD as PCP - General (Family Medicine)  This Provider for this visit: Treatment Team:  Attending Provider: Coral Ceo, NP    01/04/2021 -   Chief Complaint  Patient presents with   Follow-up    Went to urgent care 3 weeks ago.    Follow-up post COVID long-haul symptoms  HPI DOLORIS SERVANTES 65 y.o. -she has had extensive work-up.  CT scan of the chest does show some postinflammatory scarring in the bases.  Very minimal.  No ILD.  Had cardiopulmonary exercise stress test VO2 Max adequate normal for sedentary person.  This correction on the VO2 based on ideal body weight.  This suggest obesity as the reason for dyspnea.  Pulmonary function test is normal.  Overall she is feels some better but then she again continues to report alternating feelings of being good and then having episodes where she feels like her whole body is crashing and she is significantly symptomatic.  This happens 5 or 7 times a month.  However symptoms seem the same according to history but her nadir symptoms seem better.  She states 1 week prior to this visit she had a day when she thought she was coming down with the flu after getting over a cold or bronchitis.  She feels her body was flushed and coughing she went to bed several times and missed work but then the next morning she was completely fine.  This is how she describes the crash.    So far objective testing only shows obesity related dyspnea.  She was on Breo but she not on it currently.  Her current body weight is 170 pounds.  For a BMI of 25 her weight should be 145 pounds.   SYMPTOM SCALE - Nov 2021 02/29/2020  11/08/2020 abaseline 11/08/2020 During exhaustion episodes 01/03/21 regular day 01/03/21 during crash days  O2 use  ra      Shortness of Breath  0 -> 5 scale with 5 being  worst (score 6 If unable to do)      At rest 0 0 1 2.5 0 4  Simple tasks - showers, clothes change, eating, shaving 0 0 Household (dishes, doing bed, laundry) 2 1.5 1.5 3 1 5   Shopping Walking level at own pace 2 2 1.5 4 1 4   Walking up Stairs 3 3.5 1 3 2 5   Total (30-36) Dyspnea Score 17.5 6 26   How bad is your cough? 2 0.5 0. Bad during episodes 1   How bad is your fatigue 1 0.5 1 4 1 4   How bad is nausea  0 0 0 0 1  How bad is vomiting?   00 0 0 0 0  How bad is diarrhea?  0 0 0 0 0  How bad is anxiety?  0 0 0 0 3  How bad is depression  0 0 00 0 3  0 Dec 2022  01/03/21 15:40  FeNO level (ppb) 27     IMPRESSION: ct chest high res 1. Minimal, bland appearing bandlike scarring of the bilateral lung bases. No evidence of fibrotic interstitial lung disease. 2. This coarse somewhat nodular contour of the liver in the included upper abdomen, suggestive of cirrhosis. 3. Aortic atherosclerosis.   Aortic Atherosclerosis (ICD10-I70.0).     Electronically Signed   By: Jearld Lesch M.D.   On: 12/11/2020 14:59    Cpex 11/15/2    Interpretation   Notes: Patient gave an excellent effort. Pulse-oximetry remained 96-98% for the duration of exercise. Exercise was performed on a cycle ergometer starting at [facility] and increasing by 10W/min.   ECG:  Resting ECG in normal sinus rhythm. HR response appropriate. There were no sustained arrhythmias or ST-T changes. BP response appropriate.   PFT:  Pre-exercise spirometry was within normal limits. The MVV was normal. Post-exercise spirometry within normal limits.   CPX:  Exercise Capacity- Exercise testing with gas exchange demonstrates a normal peak VO2 of 15.2 ml/kg/min (85% of the age/gender/weight matched sedentary norms). The RER of 1.11 indicates a maximal effort. When adjusted to the patient's ideal body weight of 138.6 lb (62.9 kg) the peak VO2 is 18.3 ml/kg (ibw)/min (  102% of the ibw-adjusted predicted).    Cardiovascular response- The O2pulse (a surrogate for stroke volume) increased with incremental exercise reaching peak at 9 ml/beat (100% predicted). DeltaVO2/Delta WR is normal at 10 Indicating No evidence of cardiovascular impairment.   Ventilatory response- The VE/VCO2 slope normal. The oxygen uptake efficiency slope (OUES) is normal. The VO2 at the ventilatory threshold was normal at 62% of the predicted peak VO2. At peak exercise, the ventilation reached 33% of the measured MVV and breathing reserve was 78 indicating ventilatory reserve remained.  PETCO2 was  normal at 36 mmHg during peak exercise.      Conclusion: Exercise testing with gas exchange demonstrates normal functional capacity when compared to matched sedentary norms. There is no indication for cardiopulmonary abnormality or exercise-induced bronchospasm. Corrections for ideal body weight suggest body habitus is contributing to exercise intolerance.    Test, report and preliminary impression by:  Reggy Eye, MS, ACSM-RCEP  11/27/2020 3:50 PM    FINALIZED.  Chilton Greathouse MD  [facility]  12/04/2020, 5:18 PM      PFT  PFT Results Latest Ref Rng & Units 12/23/2019  FVC-Pre L 2.65  FVC-Predicted Pre % 84  FVC-Post L 2.83  FVC-Predicted Post % 90  Pre FEV1/FVC % % 83  Post FEV1/FCV % % 88  FEV1-Pre L 2.20  FEV1-Predicted Pre % 91  FEV1-Post L 2.48  DLCO uncorrected ml/min/mmHg 23.31  DLCO UNC% % 120  DLCO corrected ml/min/mmHg 23.31  DLCO COR %Predicted % 120  DLVA Predicted % 120  TLC L 5.27  TLC % Predicted % 107  RV % Predicted % 123       has a past medical history of Thyroid disease.   reports that she has never smoked. She has never used smokeless tobacco.  Past Surgical History:  Procedure Laterality Date   BACK SURGERY     NASAL SINUS SURGERY      No Known Allergies  Immunization History  Administered Date(s) Administered   Influenza,inj,Quad PF,6+ Mos  10/28/2016, 10/29/2017, 12/23/2019   Influenza,trivalent, recombinat, inj, PF 12/01/2011   PFIZER(Purple Top)SARS-COV-2 Vaccination 05/03/2019, 05/24/2019, 10/17/2019   Tdap 05/31/2015    No family history on file.   Current Outpatient Medications:    albuterol (PROVENTIL) (2.5 MG/3ML) 0.083% nebulizer solution, Take by nebulization., Disp: , Rfl:    albuterol (VENTOLIN HFA) 108 (90 Base) MCG/ACT inhaler, Inhale 2 puffs into the lungs every 6 (six) hours as needed., Disp: , Rfl:    aspirin 81 MG EC tablet, Take by mouth., Disp: , Rfl:    azelastine (ASTELIN) 0.1 % nasal spray, 1 spray., Disp: , Rfl:    buPROPion (WELLBUTRIN XL) 150 MG 24 hr tablet, TAKE 1 TABLET(150 MG) BY MOUTH DAILY, Disp: , Rfl:    chlorpheniramine (CHLOR-TRIMETON) 4 MG tablet, chlorpheniramine -aka Chlor tabs - 4 mg tablet -1 to 2 tablets at night- for management of allergies and postnasal drip at night, Disp: 60 tablet, Rfl: 3   Diclofenac Sodium CR 100 MG 24 hr tablet, Take by mouth., Disp: , Rfl:    EPINEPHrine 0.3 mg/0.3 mL IJ SOAJ injection, Inject 0.3 mLs (0.3 mg total) into the muscle as needed for anaphylaxis., Disp: 1 each, Rfl: 0   fluticasone furoate-vilanterol (BREO ELLIPTA) 100-25 MCG/INH AEPB, Inhale 1 puff into the lungs daily., Disp: 14 each, Rfl: 0   levocetirizine (XYZAL) 5 MG tablet, Take by mouth., Disp: , Rfl:    levothyroxine (SYNTHROID) 50 MCG tablet, Take  by mouth., Disp: , Rfl:    Multiple Vitamin (MULTIVITAMIN) capsule, Take 1 capsule by mouth daily., Disp: , Rfl:    pantoprazole (PROTONIX) 20 MG tablet, Take 1 tablet (20 mg total) by mouth 2 (two) times daily before a meal., Disp: 60 tablet, Rfl: 3      Objective:   Vitals:   01/03/21 1531  BP: 122/82  Pulse: 83  SpO2: 97%  Weight: 170 lb 3.2 oz (77.2 kg)  Height:  (1.6 m)    Estimated body mass index is 30.15 kg/m as calculated from the following:   Height as of this encounter:  (1.6 m).   Weight as of this encounter:  170 lb 3.2 oz (77.2 kg).  @  [employer]   01/03/21 1531  Weight: 170 lb 3.2 oz (77.2 kg)     Physical Exam    General: No distress. Looks well Neuro: Alert and Oriented x 3. GCS 15. Speech normal Psych: Pleasant Resp:  Barrel Chest - no.  Wheeze - no, Crackles - no, No overt respiratory distress CVS: Normal heart sounds. Murmurs - no Ext: Stigmata of Connective Tissue Disease - no HEENT: Normal upper airway. PEERL +. No post nasal drip        Assessment:       ICD-10-CM   1. COVID-19 long hauler manifesting chronic fatigue  G93.32 POCT EXHALED NITRIC OXIDE   U09.9 AMB referral to pulmonary rehabilitation    2. Post-COVID syndrome  U09.9 AMB referral to pulmonary rehabilitation    3. Dyspnea on exertion  R06.09 AMB referral to pulmonary rehabilitation         Plan:     Patient Instructions     ICD-10-CM   1. Dyspnea on exertion  R06.09     2. Post-COVID syndrome  U09.9     3. COVID-19 long hauler manifesting chronic fatigue  G93.32    U09.9     4. COVID-19 long hauler manifesting chronic dyspnea  R06.09    U09.9     5. History of asthma  Z87.09       You are having symptoms of post COVID long-haul that can partially explain your shortness of breath and episodes of fatigue  Weight and loss of physical fitness also reason for shortness of breath  ASthma might be or  not be an issue  Plan  - If you want to hold off breo that is fine  - refer pulmonary rehab at [facility] or [facility] - encourage weight loss - goal weight around 145# (currently 170#)  - do this via PCP Eartha Inch, MD  - continue albuterol and mist as needed and other medicines scheduled  Follow-up - Return in 4 to 5 months or sooner if needed    SIGNATURE    Dr. Kalman Shan, M.D., F.C.C.P,  Pulmonary and Critical Care Medicine Staff Physician, [facility] Director - Interstitial Lung Disease  Program  Pulmonary Fibrosis [facility] at [facility] [place], [place], [address]  Pager: [phone], If no answer or between  15:00h - 7:00h: call [phone] Telephone: [phone]  5:30 PM 01/04/2021

## 2021-01-04 ENCOUNTER — Telehealth (HOSPITAL_COMMUNITY): Payer: Self-pay

## 2021-01-04 NOTE — Telephone Encounter (Signed)
Pulmonary rehab office referral recv'ed, printed and given to RN for review. 

## 2021-01-04 NOTE — Telephone Encounter (Signed)
Called patient to see if she is interested in the Pulmonary Rehab Program. Patient expressed interest. Explained scheduling process and went over insurance process, patient verbalized understanding. Someone from our pulmonary rehab staff will contact pt at a later time. °

## 2021-01-23 ENCOUNTER — Encounter (HOSPITAL_COMMUNITY): Payer: Self-pay | Admitting: *Deleted

## 2021-01-23 NOTE — Progress Notes (Signed)
Received referral from Dr. Marchelle Gearing for this pt to participate in pulmonary rehab with the primary diagnosis of dyspnea on exertion and secondary Post Covid-19.  Pt with well documented greater than 4 weeks of persistent respiratory dysfunction with shortness of breath and cough since contracting Covid in October 2020.  Clinical review of pt follow up appt on 01/03/21  Pulmonary office note.  Pt with Covid Risk Score - 2. Pt appropriate for scheduling for Pulmonary rehab.  Will forward to support staff for scheduling and verification of insurance eligibility/benefits with pt consent. Alanson Aly, BSN Cardiac and Emergency planning/management officer

## 2021-01-28 ENCOUNTER — Other Ambulatory Visit: Payer: Self-pay | Admitting: Pulmonary Disease

## 2021-01-28 DIAGNOSIS — R053 Chronic cough: Secondary | ICD-10-CM

## 2021-02-05 ENCOUNTER — Telehealth (HOSPITAL_COMMUNITY): Payer: Self-pay | Admitting: Family Medicine

## 2021-02-06 ENCOUNTER — Telehealth (HOSPITAL_COMMUNITY): Payer: Self-pay | Admitting: *Deleted

## 2021-02-06 NOTE — Telephone Encounter (Signed)
Returned call from message left on departmental voicemail regarding scheduling pulmonary rehab. Reviewed referral process. Pt aware of the wait list and understands she will be contacted once she is up on the list.  Reviewed with pt general expectations for participating in pulmonary rehab.  Pt thanked me for the call.Alanson Aly, BSN Cardiac and Emergency planning/management officer

## 2021-03-14 ENCOUNTER — Telehealth (HOSPITAL_COMMUNITY): Payer: Self-pay

## 2021-03-14 NOTE — Telephone Encounter (Signed)
Called patient to see if she was interested in participating in the Pulmonary Rehab Program. Patient stated yes. Patient will come in for orientation on 04/08/2021@10 :30am and will attend the 10:15am exercise class.   Pensions consultant.

## 2021-03-14 NOTE — Telephone Encounter (Signed)
Pt insurance is active and benefits verified through [employer] Co-pay 0, DED $5,500/0 met, out of pocket $9,100/$264.06 met, co-insurance 50%after ded. . no pre-authorization required, [employer] 03/14/2021_0 :33am , REF# [id number]

## 2021-04-03 ENCOUNTER — Telehealth (HOSPITAL_COMMUNITY): Payer: Self-pay | Admitting: *Deleted

## 2021-04-08 ENCOUNTER — Other Ambulatory Visit: Payer: Self-pay

## 2021-04-08 ENCOUNTER — Encounter (HOSPITAL_COMMUNITY)
Admission: RE | Admit: 2021-04-08 | Discharge: 2021-04-08 | Disposition: A | Discharge status: Home / Self Care | Payer: BC Managed Care – PPO | Source: Ambulatory Visit | Attending: Internal Medicine | Admitting: Internal Medicine

## 2021-04-08 VITALS — BP 118/64 | HR 83 | Ht 63.0 in | Wt 166.0 lb

## 2021-04-08 DIAGNOSIS — U099 Post covid-19 condition, unspecified: Secondary | ICD-10-CM | POA: Diagnosis present

## 2021-04-08 DIAGNOSIS — R0609 Other forms of dyspnea: Secondary | ICD-10-CM | POA: Insufficient documentation

## 2021-04-08 NOTE — Progress Notes (Signed)
Mary Orr 66 y.o. female ?Pulmonary Rehab Orientation Note ?This patient who was referred to Pulmonary Rehab by Dr. Marchelle Gearing with the diagnosis of DOE; Post COVID-19 arrived today in Cardiac and Pulmonary Rehab. She  arrived with ambulatory normal gait. She  does not carry portable oxygen. Per pt, Mary Orr uses oxygen never. Color good, skin warm and dry. Patient is oriented to time and place. Patient's medical history, psychosocial health, and medications reviewed. Psychosocial assessment reveals pt lives with spouse. 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 Pt hobbies include spending time with others. Pt reports her stress level is low. She does not note any stressors. Pt does not exhibit signs of depression. PHQ2/9 score 0/0. Mary Orr shows good  coping skills with positive outlook on life. Offered emotional support and reassurance. Will continue to monitor. Physical assessment performed by Karlene Lineman, RN. Please see their orientation physical assessment note. Mary Orr reports she does take medications as prescribed. Patient states she follows a regular  diet. The patient has been trying to lose weight through a healthy diet and exercise program.. Pt's weight will be monitored closely. Demonstration and practice of PLB using pulse oximeter. Mary Orr able to return demonstration satisfactorily. Safety and hand hygiene in the exercise area reviewed with patient. Mary Orr voices understanding of the information reviewed. Department expectations discussed with patient and achievable goals were set. The patient shows enthusiasm about attending the program and we look forward to working with Mary Orr. Mary Orr completed a 6 min walk test today and is scheduled to begin exercise on 04/16/21 at 10:15 am.  ? ?1030-1210 ?Joya San, MS, ACSM-CEP ?  ?

## 2021-04-08 NOTE — Progress Notes (Signed)
Pulmonary Individual Treatment Plan  Patient Details  Name: Mary Orr MRN: [id number] Date of Birth: 11-17-1955 Referring Provider:   Doristine Devoid Pulmonary Rehab Walk Test from 04/08/2021 in [facility] CARDI[facility]  Referring Provider Ramaswamy       Initial Encounter Date:  Flowsheet Row Pulmonary Rehab Walk Test from 04/08/2021 in [facility] CARD[facility] REHAB  Date 04/08/21       Visit Diagnosis: Dyspnea on exertion  Post-COVID-19 syndrome  Patient's Home Medications on Admission:   Current Outpatient Medications:    albuterol (PROVENTIL) (2.5 MG/3ML) 0.083% nebulizer solution, Take by nebulization., Disp: , Rfl:    albuterol (VENTOLIN HFA) 108 (90 Base) MCG/ACT inhaler, Inhale 2 puffs into the lungs every 6 (six) hours as needed., Disp: , Rfl:    aspirin 81 MG EC tablet, Take by mouth., Disp: , Rfl:    azelastine (ASTELIN) 0.1 % nasal spray, 1 spray., Disp: , Rfl:    buPROPion (WELLBUTRIN XL) 150 MG 24 hr tablet, TAKE 1 TABLET(150 MG) BY MOUTH DAILY, Disp: , Rfl:    chlorpheniramine (ALLER-CHLOR) 4 MG tablet, TAKE 1-2 TABLETS AT NIGHT - FOR MANAGEMENT OF ALLERGIES AND POSTNASAL DRIP AT NIGHT, Disp: 60 tablet, Rfl: 3   Diclofenac Sodium CR 100 MG 24 hr tablet, Take by mouth., Disp: , Rfl:    EPINEPHrine 0.3 mg/0.3 mL IJ SOAJ injection, Inject 0.3 mLs (0.3 mg total) into the muscle as needed for anaphylaxis., Disp: 1 each, Rfl: 0   levocetirizine (XYZAL) 5 MG tablet, Take by mouth., Disp: , Rfl:    levothyroxine (SYNTHROID) 50 MCG tablet, Take by mouth., Disp: , Rfl:    Multiple Vitamin (MULTIVITAMIN) capsule, Take 1 capsule by mouth daily., Disp: , Rfl:    pantoprazole (PROTONIX) 20 MG tablet, Take 1 tablet (20 mg total) by mouth 2 (two) times daily before a meal., Disp: 60 tablet, Rfl: 3   fluticasone furoate-vilanterol (BREO ELLIPTA) 100-25 MCG/INH AEPB, Inhale 1 puff into the lungs daily. (Patient not taking: Reported on 04/08/2021),  Disp: 14 each, Rfl: 0  Past Medical History: Past Medical History:  Diagnosis Date   Thyroid disease     Tobacco Use: Social History   Tobacco Use  Smoking Status Never  Smokeless Tobacco Never    Labs: Recent Review Flowsheet Data   There is no flowsheet data to display.     Capillary Blood Glucose: No results found for: GLUCAP   Pulmonary Assessment Scores:  Pulmonary Assessment Scores     Row Name 04/08/21 1134         ADL UCSD   ADL Phase Entry     SOB Score total 10       CAT Score   CAT Score 10       mMRC Score   mMRC Score 2             UCSD: Self-administered rating of dyspnea associated with activities of daily living (ADLs) 6-point scale (0 = "not at all" to 5 = "maximal or unable to do because of breathlessness")  Scoring Scores range from 0 to 120.  Minimally important difference is 5 units  CAT: CAT can identify the health impairment of COPD patients and is better correlated with disease progression.  CAT has a scoring range of zero to 40. The CAT score is classified into four groups of low (less than 10), medium (10 - 20), high (21-30) and very high (31-40) based on the impact level of disease  on health status. A CAT score over 10 suggests significant symptoms.  A worsening CAT score could be explained by an exacerbation, poor medication adherence, poor inhaler technique, or progression of COPD or comorbid conditions.  CAT MCID is 2 points  mMRC: mMRC (Modified Medical Research Council) Dyspnea Scale is used to assess the degree of baseline functional disability in patients of respiratory disease due to dyspnea. No minimal important difference is established. A decrease in score of 1 point or greater is considered a positive change.   Pulmonary Function Assessment:  Pulmonary Function Assessment - 04/08/21 1245       Breath   Bilateral Breath Sounds Clear    Shortness of Breath Yes;Limiting activity;Fear of Shortness of Breath              Exercise Target Goals: Exercise Program Goal: Individual exercise prescription set using results from initial 6 min walk test and THRR while considering  patients activity barriers and safety.   Exercise Prescription Goal: Initial exercise prescription builds to 30-45 minutes a day of aerobic activity, 2-3 days per week.  Home exercise guidelines will be given to patient during program as part of exercise prescription that the participant will acknowledge.  Activity Barriers & Risk Stratification:  Activity Barriers & Cardiac Risk Stratification - 04/08/21 1114       Activity Barriers & Cardiac Risk Stratification   Activity Barriers Shortness of Breath;Muscular Weakness;Deconditioning;Neck/Spine Problems    Cardiac Risk Stratification Low             6 Minute Walk:  6 Minute Walk     Row Name 04/08/21 1239         6 Minute Walk   Phase Initial     Distance 1760 feet     Walk Time 6 minutes     # of Rest Breaks 0     MPH 3.33     METS 4.13     RPE 11     Perceived Dyspnea  1     VO2 Peak 14.44     Symptoms No     Resting HR 80 bpm     Resting BP 118/64     Resting Oxygen Saturation  96 %     Exercise Oxygen Saturation  during 6 min walk 93 %     Max Ex. HR 125 bpm     Max Ex. BP 134/68     2 Minute Post BP 116/64       Interval HR   1 Minute HR 84     2 Minute HR 121     3 Minute HR 122     4 Minute HR 121     5 Minute HR 121     6 Minute HR 125     2 Minute Post HR 90     Interval Heart Rate? Yes       Interval Oxygen   Interval Oxygen? Yes     Baseline Oxygen Saturation % 96 %     1 Minute Oxygen Saturation % 93 %     1 Minute Liters of Oxygen 0 L     2 Minute Oxygen Saturation % 94 %     2 Minute Liters of Oxygen 0 L     3 Minute Oxygen Saturation % 96 %     3 Minute Liters of Oxygen 0 L     4 Minute Oxygen Saturation % 94 %     4 Minute Liters  of Oxygen 0 L     5 Minute Oxygen Saturation % 95 %     5 Minute Liters of Oxygen 0 L      6 Minute Oxygen Saturation % 95 %     6 Minute Liters of Oxygen 0 L     2 Minute Post Oxygen Saturation % 97 %     2 Minute Post Liters of Oxygen 0 L              Oxygen Initial Assessment:  Oxygen Initial Assessment - 04/08/21 1111       Home Oxygen   Home Oxygen Device None    Sleep Oxygen Prescription None    Home Exercise Oxygen Prescription None    Home Resting Oxygen Prescription None      Initial 6 min Walk   Oxygen Used None      Program Oxygen Prescription   Program Oxygen Prescription None      Intervention   Short Term Goals To learn and exhibit compliance with exercise, home and travel O2 prescription;To learn and understand importance of monitoring SPO2 with pulse oximeter and demonstrate accurate use of the pulse oximeter.;To learn and understand importance of maintaining oxygen saturations>88%;To learn and demonstrate proper pursed lip breathing techniques or other breathing techniques. ;To learn and demonstrate proper use of respiratory medications    Long  Term Goals Exhibits compliance with exercise, home  and travel O2 prescription;Verbalizes importance of monitoring SPO2 with pulse oximeter and return demonstration;Maintenance of O2 saturations>88%;Exhibits proper breathing techniques, such as pursed lip breathing or other method taught during program session;Compliance with respiratory medication;Demonstrates proper use of MDIs             Oxygen Re-Evaluation:   Oxygen Discharge (Final Oxygen Re-Evaluation):   Initial Exercise Prescription:  Initial Exercise Prescription - 04/08/21 1200       Date of Initial Exercise RX and Referring Provider   Date 04/08/21    Referring Provider Ramaswamy    Expected Discharge Date 06/13/21      NuStep   Level 1    SPM 80    Minutes 15      Track   Minutes 15    METs 4.13      Prescription Details   Frequency (times per week) 2    Duration Progress to 30 minutes of continuous aerobic without  signs/symptoms of physical distress      Intensity   THRR 40-80% of Max Heartrate 62-125    Ratings of Perceived Exertion 11-13    Perceived Dyspnea 0-4      Progression   Progression Continue to progress workloads to maintain intensity without signs/symptoms of physical distress.      Resistance Training   Training Prescription Yes    Weight blue bands    Reps 10-15             Perform Capillary Blood Glucose checks as needed.  Exercise Prescription Changes:   Exercise Comments:   Exercise Goals and Review:   Exercise Goals     Row Name 04/08/21 1115             Exercise Goals   Increase Physical Activity Yes       Intervention Provide advice, education, support and counseling about physical activity/exercise needs.;Develop an individualized exercise prescription for aerobic and resistive training based on initial evaluation findings, risk stratification, comorbidities and participant's personal goals.       Expected Outcomes Short Term: Attend rehab  on a regular basis to increase amount of physical activity.;Long Term: Add in home exercise to make exercise part of routine and to increase amount of physical activity.;Long Term: Exercising regularly at least 3-5 days a week.       Increase Strength and Stamina Yes       Intervention Provide advice, education, support and counseling about physical activity/exercise needs.;Develop an individualized exercise prescription for aerobic and resistive training based on initial evaluation findings, risk stratification, comorbidities and participant's personal goals.       Expected Outcomes Short Term: Increase workloads from initial exercise prescription for resistance, speed, and METs.;Short Term: Perform resistance training exercises routinely during rehab and add in resistance training at home;Long Term: Improve cardiorespiratory fitness, muscular endurance and strength as measured by increased METs and functional capacity  (6MWT)       Able to understand and use rate of perceived exertion (RPE) scale Yes       Intervention Provide education and explanation on how to use RPE scale       Expected Outcomes Short Term: Able to use RPE daily in rehab to express subjective intensity level;Long Term:  Able to use RPE to guide intensity level when exercising independently       Able to understand and use Dyspnea scale Yes       Intervention Provide education and explanation on how to use Dyspnea scale       Expected Outcomes Short Term: Able to use Dyspnea scale daily in rehab to express subjective sense of shortness of breath during exertion;Long Term: Able to use Dyspnea scale to guide intensity level when exercising independently       Knowledge and understanding of Target Heart Rate Range (THRR) Yes       Intervention Provide education and explanation of THRR including how the numbers were predicted and where they are located for reference       Expected Outcomes Short Term: Able to state/look up THRR;Long Term: Able to use THRR to govern intensity when exercising independently;Short Term: Able to use daily as guideline for intensity in rehab       Understanding of Exercise Prescription Yes       Intervention Provide education, explanation, and written materials on patient's individual exercise prescription       Expected Outcomes Short Term: Able to explain program exercise prescription;Long Term: Able to explain home exercise prescription to exercise independently                Exercise Goals Re-Evaluation :   Discharge Exercise Prescription (Final Exercise Prescription Changes):   Nutrition:  Target Goals: Understanding of nutrition guidelines, daily intake of sodium 1500mg , cholesterol 200mg , calories 30% from fat and 7% or less from saturated fats, daily to have 5 or more servings of fruits and vegetables.  Biometrics:   Post Biometrics - 04/08/21 1059        Post  Biometrics   Grip Strength 22  kg             Nutrition Therapy Plan and Nutrition Goals:   Nutrition Assessments:  MEDIFICTS Score Key: ?70 Need to make dietary changes  40-70 Heart Healthy Diet ? 40 Therapeutic Level Cholesterol Diet   Picture Your Plate Scores: <16<40 Unhealthy dietary pattern with much room for improvement. 41-50 Dietary pattern unlikely to meet recommendations for good health and room for improvement. 51-60 More healthful dietary pattern, with some room for improvement.  >60 Healthy dietary pattern, although there may be  some specific behaviors that could be improved.    Nutrition Goals Re-Evaluation:   Nutrition Goals Discharge (Final Nutrition Goals Re-Evaluation):   Psychosocial: Target Goals: Acknowledge presence or absence of significant depression and/or stress, maximize coping skills, provide positive support system. Participant is able to verbalize types and ability to use techniques and skills needed for reducing stress and depression.  Initial Review & Psychosocial Screening:  Initial Psych Review & Screening - 04/08/21 1110       Initial Review   Current issues with None Identified      Family Dynamics   Good Support System? Yes    Comments husband      Barriers   Psychosocial barriers to participate in program There are no identifiable barriers or psychosocial needs.      Screening Interventions   Interventions Encouraged to exercise             Quality of Life Scores:  Scores of 19 and below usually indicate a poorer quality of life in these areas.  A difference of  2-3 points is a clinically meaningful difference.  A difference of 2-3 points in the total score of the Quality of Life Index has been associated with significant improvement in overall quality of life, self-image, physical symptoms, and general health in studies assessing change in quality of life.  PHQ-9: Recent Review Flowsheet Data     Depression screen [facility] 2/9 04/08/2021   Decreased  Interest 0   Down, Depressed, Hopeless 0   PHQ - 2 Score 0   Altered sleeping 0   Tired, decreased energy 0   Change in appetite 0   Feeling bad or failure about yourself  0   Trouble concentrating 0   Moving slowly or fidgety/restless 0   Suicidal thoughts 0   PHQ-9 Score 0   Difficult doing work/chores Not difficult at all      Interpretation of Total Score  Total Score Depression Severity:  1-4 = Minimal depression, 5-9 = Mild depression, 10-14 = Moderate depression, 15-19 = Moderately severe depression, 20-27 = Severe depression   Psychosocial Evaluation and Intervention:  Psychosocial Evaluation - 04/08/21 1110       Psychosocial Evaluation & Interventions   Interventions Encouraged to exercise with the program and follow exercise prescription    Expected Outcomes For Marisah to participate in pulmonary rehab    Continue Psychosocial Services  Follow up required by staff             Psychosocial Re-Evaluation:   Psychosocial Discharge (Final Psychosocial Re-Evaluation):   Education: Education Goals: Education classes will be provided on a weekly basis, covering required topics. Participant will state understanding/return demonstration of topics presented.  Learning Barriers/Preferences:  Learning Barriers/Preferences - 04/08/21 1112       Learning Barriers/Preferences   Learning Barriers Sight   wears glasses   Learning Preferences Audio;Computer/Internet;Group Instruction;Individual Instruction;Pictoral;Verbal Instruction;Skilled Demonstration;Video;Written Material             Education Topics: Risk Factor Reduction:  -Group instruction that is supported by a PowerPoint presentation. Instructor discusses the definition of a risk factor, different risk factors for pulmonary disease, and how the heart and lungs work together.     Nutrition for Pulmonary Patient:  -Group instruction provided by PowerPoint slides, verbal discussion, and written  materials to support subject matter. The instructor gives an explanation and review of healthy diet recommendations, which includes a discussion on weight management, recommendations for fruit and vegetable consumption, as well as  protein, fluid, caffeine, fiber, sodium, sugar, and alcohol. Tips for eating when patients are short of breath are discussed.   Pursed Lip Breathing:  -Group instruction that is supported by demonstration and informational handouts. Instructor discusses the benefits of pursed lip and diaphragmatic breathing and detailed demonstration on how to preform both.     Oxygen Safety:  -Group instruction provided by PowerPoint, verbal discussion, and written material to support subject matter. There is an overview of What is Oxygen and Why do we need it.  Instructor also reviews how to create a safe environment for oxygen use, the importance of using oxygen as prescribed, and the risks of noncompliance. There is a brief discussion on traveling with oxygen and resources the patient may utilize.   Oxygen Equipment:  -Group instruction provided by [facility] Staff utilizing handouts, written materials, and equipment demonstrations.   Signs and Symptoms:  -Group instruction provided by written material and verbal discussion to support subject matter. Warning signs and symptoms of infection, stroke, and heart attack are reviewed and when to call the physician/911 reinforced. Tips for preventing the spread of infection discussed.   Advanced Directives:  -Group instruction provided by verbal instruction and written material to support subject matter. Instructor reviews Advanced Directive laws and proper instruction for filling out document.   Pulmonary Video:  -Group video education that reviews the importance of medication and oxygen compliance, exercise, good nutrition, pulmonary hygiene, and pursed lip and diaphragmatic breathing for the pulmonary patient.   Exercise for  the Pulmonary Patient:  -Group instruction that is supported by a PowerPoint presentation. Instructor discusses benefits of exercise, core components of exercise, frequency, duration, and intensity of an exercise routine, importance of utilizing pulse oximetry during exercise, safety while exercising, and options of places to exercise outside of rehab.     Pulmonary Medications:  -Verbally interactive group education provided by instructor with focus on inhaled medications and proper administration.   Anatomy and Physiology of the Respiratory System and Intimacy:  -Group instruction provided by PowerPoint, verbal discussion, and written material to support subject matter. Instructor reviews respiratory cycle and anatomical components of the respiratory system and their functions. Instructor also reviews differences in obstructive and restrictive respiratory diseases with examples of each. Intimacy, Sex, and Sexuality differences are reviewed with a discussion on how relationships can change when diagnosed with pulmonary disease. Common sexual concerns are reviewed.   MD DAY -A group question and answer session with a medical doctor that allows participants to ask questions that relate to their pulmonary disease state.   OTHER EDUCATION -Group or individual verbal, written, or video instructions that support the educational goals of the pulmonary rehab program.   Holiday Eating Survival Tips:  -Group instruction provided by PowerPoint slides, verbal discussion, and written materials to support subject matter. The instructor gives patients tips, tricks, and techniques to help them not only survive but enjoy the holidays despite the onslaught of food that accompanies the holidays.   Knowledge Questionnaire Score:  Knowledge Questionnaire Score - 04/08/21 1131       Knowledge Questionnaire Score   Pre Score 17/18             Core Components/Risk Factors/Patient Goals at Admission:   Personal Goals and Risk Factors at Admission - 04/08/21 1112       Core Components/Risk Factors/Patient Goals on Admission    Weight Management Yes    Goal Weight: Long Term 136 lb (61.7 kg)    Improve shortness of breath  with ADL's Yes    Intervention Provide education, individualized exercise plan and daily activity instruction to help decrease symptoms of SOB with activities of daily living.    Expected Outcomes Short Term: Improve cardiorespiratory fitness to achieve a reduction of symptoms when performing ADLs;Long Term: Be able to perform more ADLs without symptoms or delay the onset of symptoms             Core Components/Risk Factors/Patient Goals Review:    Core Components/Risk Factors/Patient Goals at Discharge (Final Review):    ITP Comments:  Dr. Mechele Collin is Medical Director for Pulmonary Rehab at [facility].

## 2021-04-08 NOTE — Progress Notes (Signed)
Mary Orr 66 y.o. female ? ?Initial Psychosocial Assessment ? ?Pt psychosocial assessment reveals pt lives with their spouse. 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 Her and her husband are taking a break to focus on health but are planning to come back. Pt hobbies include spending time with others. Mary Orr loves spending time with her children and grandchildren. Pt reports her stress level is low. Pt does not report any stressors. Pt does not exhibit signs of depression. Pt shows good  coping skills with positive outlook . Offered emotional support and reassurance. Will continue to monitor.  ? ?04/08/2021 11:26 AM ?  ?

## 2021-04-11 NOTE — Progress Notes (Signed)
Pulmonary Rehab Orientation Physical Assessment Note ? ?Physical assessment reveals  Pt is alert and oriented x 3.  Heart rate is normal, breath sounds clear to auscultation, no wheezes, rales, or rhonchi. Reports productive at times cough. Bowel sounds present.  Pt denies abdominal discomfort, nausea, vomiting or diarrhea. Grip strength equal, strong. Distal pulses palpable; no swelling to lower extremities. Cherre Huger, BSN ?Cardiac and Pulmonary Rehab Nurse Navigator  ? ? ?

## 2021-04-16 ENCOUNTER — Encounter (HOSPITAL_COMMUNITY)
Admission: RE | Admit: 2021-04-16 | Discharge: 2021-04-16 | Disposition: A | Discharge status: Home / Self Care | Payer: BC Managed Care – PPO | Source: Ambulatory Visit | Attending: Internal Medicine | Admitting: Internal Medicine

## 2021-04-16 ENCOUNTER — Other Ambulatory Visit: Payer: Self-pay

## 2021-04-16 VITALS — Wt 168.0 lb

## 2021-04-16 DIAGNOSIS — R0609 Other forms of dyspnea: Secondary | ICD-10-CM | POA: Diagnosis not present

## 2021-04-16 DIAGNOSIS — U099 Post covid-19 condition, unspecified: Secondary | ICD-10-CM

## 2021-04-16 NOTE — Progress Notes (Signed)
Daily Session Note ? ?Patient Details  ?Name: Mary Orr ?MRN: [id number] ?Date of Birth: April 18, 1955 ?Referring Provider:   ?Flowsheet Row Pulmonary Rehab Walk Test from 04/08/2021 in [place]  ?Referring Provider Ramaswamy  ? ?  ? ? ?Encounter Date: 04/16/2021 ? ?Check In: ? Session Check In - 04/16/21 1125   ? ?  ? Check-In  ? Supervising physician immediately available to respond to emergencies [facility] immediately available   ? Physician(s) Dr. Cruzita Lederer   ? Location [facility]   ? Staff Present Rosebud Poles, RN, Quentin Ore, MS, ACSM-CEP, Exercise Physiologist;David Lilyan Punt, MS, ACSM-CEP, CCRP, Exercise Physiologist;Carlette Wilber Oliphant, Therapist, sports, BSN   ? Virtual Visit No   ? Medication changes reported     No   ? Fall or balance concerns reported    No   ? Tobacco Cessation No Change   ? Warm-up and Cool-down Performed as group-led instruction   ? Resistance Training Performed Yes   ? VAD Patient? No   ? PAD/SET Patient? No   ?  ? Pain Assessment  ? Currently in Pain? No/denies   ? Multiple Pain Sites No   ? ?  ?  ? ?  ? ? ?Capillary Blood Glucose: ?No results found for this or any previous visit (from the past 24 hour(s)). ? ? Exercise Prescription Changes - 04/16/21 1200   ? ?  ? Response to Exercise  ? Blood Pressure (Admit) 110/66   ? Blood Pressure (Exercise) 116/70   ? Blood Pressure (Exit) 114/64   ? Heart Rate (Admit) 72 bpm   ? Heart Rate (Exercise) 109 bpm   ? Heart Rate (Exit) 84 bpm   ? Oxygen Saturation (Admit) 95 %   ? Oxygen Saturation (Exercise) 92 %   ? Oxygen Saturation (Exit) 97 %   ? Rating of Perceived Exertion (Exercise) 11   ? Perceived Dyspnea (Exercise) 1   ? Duration Continue with 30 min of aerobic exercise without signs/symptoms of physical distress.   ? Intensity THRR unchanged   ?  ? Progression  ? Progression Continue to progress workloads to maintain intensity without signs/symptoms of physical distress.   ?  ? Resistance  Training  ? Training Prescription Yes   ? Weight blue bands   ? Reps 10-15   ? Time 10 Minutes   ?  ? NuStep  ? Level 1   ? SPM 80   ? Minutes 15   ? METs 2.2   ?  ? Track  ? Laps 19   ? Minutes 15   ? METs 3.2   ? ?  ?  ? ?  ? ? ?Social History  ? ?Tobacco Use  ?Smoking Status Never  ?Smokeless Tobacco Never  ? ? ?Goals Met:  ?Proper associated with RPD/PD & O2 Sat ?Exercise tolerated well ?No report of concerns or symptoms today ?Strength training completed today ? ?Goals Unmet:  ?Not Applicable ? ?Comments: Service time is from 1020 to 1132.  ? ? ?Dr. Rodman Pickle is Medical Director for Pulmonary Rehab at [facility].  ?

## 2021-04-18 ENCOUNTER — Encounter (HOSPITAL_COMMUNITY)
Admission: RE | Admit: 2021-04-18 | Discharge: 2021-04-18 | Disposition: A | Discharge status: Home / Self Care | Payer: BC Managed Care – PPO | Source: Ambulatory Visit | Attending: Internal Medicine | Admitting: Internal Medicine

## 2021-04-18 ENCOUNTER — Telehealth (HOSPITAL_COMMUNITY): Payer: Self-pay | Admitting: Family Medicine

## 2021-04-23 ENCOUNTER — Other Ambulatory Visit: Payer: Self-pay

## 2021-04-23 ENCOUNTER — Encounter (HOSPITAL_COMMUNITY)
Admission: RE | Admit: 2021-04-23 | Discharge: 2021-04-23 | Disposition: A | Discharge status: Home / Self Care | Payer: BC Managed Care – PPO | Source: Ambulatory Visit | Attending: Internal Medicine | Admitting: Internal Medicine

## 2021-04-23 DIAGNOSIS — U099 Post covid-19 condition, unspecified: Secondary | ICD-10-CM

## 2021-04-23 DIAGNOSIS — R0609 Other forms of dyspnea: Secondary | ICD-10-CM

## 2021-04-23 NOTE — Progress Notes (Signed)
Daily Session Note ? ?Patient Details  ?Name: Mary Orr ?MRN: [id number] ?Date of Birth: [birth year] ?Referring Provider:   ?Flowsheet Row Pulmonary Rehab Walk Test from 04/08/2021 in [place]  ?Referring Provider Mary Orr  ? ?  ? ? ?Encounter Date: 04/23/2021 ? ?Check In: ? Session Check In - 04/23/21 1127   ? ?  ? Check-In  ? Supervising physician immediately available to respond to emergencies [facility] immediately available   ? Physician(s) Dr. Horton Orr   ? Location [facility]   ? Staff Present Mary Poles, RN, Mary Ore, MS, ACSM-CEP, Exercise Physiologist;Mary Orr Mary Evert, RN   ? Virtual Visit No   ? Medication changes reported     No   ? Fall or balance concerns reported    No   ? Tobacco Cessation No Change   ? Warm-up and Cool-down Performed as group-led instruction   ? Resistance Training Performed Yes   ? VAD Patient? No   ? PAD/SET Patient? No   ?  ? Pain Assessment  ? Currently in Pain? No/denies   ? Multiple Pain Sites No   ? ?  ?  ? ?  ? ? ?Capillary Blood Glucose: ?No results found for this or any previous visit (from the past 24 hour(s)). ? ? ? ?Social History  ? ?Tobacco Use  ?Smoking Status Never  ?Smokeless Tobacco Never  ? ? ?Goals Met:  ?Exercise tolerated well ?No report of concerns or symptoms today ?Strength training completed today ? ?Goals Unmet:  ?Not Applicable ? ?Comments: Service time is from 1020 to 1140 ? ? ? ?Dr. Rodman Orr is Medical Director for Pulmonary Rehab at [facility].  ?

## 2021-04-24 NOTE — Progress Notes (Signed)
Pulmonary Individual Treatment Plan ? ?Patient Details  ?Name: Mary Orr ?MRN: [id number] ?Date of Birth: [birth year] ?Referring Provider:   ?Flowsheet Row Pulmonary Rehab Walk Test from 04/08/2021 in [place]  ?Referring Provider Ramaswamy  ? ?  ? ? ?Initial Encounter Date:  ?Flowsheet Row Pulmonary Rehab Walk Test from 04/08/2021 in [place]  ?Date 04/08/21  ? ?  ? ? ?Visit Diagnosis: Dyspnea on exertion ? ?Post-COVID-19 syndrome ? ?Patient's Home Medications on Admission:  ? ?Current Outpatient Medications:  ?  albuterol (PROVENTIL) (2.5 MG/3ML) 0.083% nebulizer solution, Take by nebulization., Disp: , Rfl:  ?  albuterol (VENTOLIN HFA) 108 (90 Base) MCG/ACT inhaler, Inhale 2 puffs into the lungs every 6 (six) hours as needed., Disp: , Rfl:  ?  aspirin 81 MG EC tablet, Take by mouth., Disp: , Rfl:  ?  azelastine (ASTELIN) 0.1 % nasal spray, 1 spray., Disp: , Rfl:  ?  buPROPion (WELLBUTRIN XL) 150 MG 24 hr tablet, TAKE 1 TABLET(150 MG) BY MOUTH DAILY, Disp: , Rfl:  ?  chlorpheniramine (ALLER-CHLOR) 4 MG tablet, TAKE 1-2 TABLETS AT NIGHT - FOR MANAGEMENT OF ALLERGIES AND POSTNASAL DRIP AT NIGHT, Disp: 60 tablet, Rfl: 3 ?  Diclofenac Sodium CR 100 MG 24 hr tablet, Take by mouth., Disp: , Rfl:  ?  EPINEPHrine 0.3 mg/0.3 mL IJ SOAJ injection, Inject 0.3 mLs (0.3 mg total) into the muscle as needed for anaphylaxis., Disp: 1 each, Rfl: 0 ?  fluticasone furoate-vilanterol (BREO ELLIPTA) 100-25 MCG/INH AEPB, Inhale 1 puff into the lungs daily. (Patient not taking: Reported on 04/08/2021), Disp: 14 each, Rfl: 0 ?  levocetirizine (XYZAL) 5 MG tablet, Take by mouth., Disp: , Rfl:  ?  levothyroxine (SYNTHROID) 50 MCG tablet, Take by mouth., Disp: , Rfl:  ?  Multiple Vitamin (MULTIVITAMIN) capsule, Take 1 capsule by mouth daily., Disp: , Rfl:  ?  pantoprazole (PROTONIX) 20 MG tablet, Take 1 tablet (20 mg total) by mouth 2 (two) times daily before a meal., Disp:  60 tablet, Rfl: 3 ? ?Past Medical History: ?Past Medical History:  ?Diagnosis Date  ? Thyroid disease   ? ? ?Tobacco Use: ?Social History  ? ?Tobacco Use  ?Smoking Status Never  ?Smokeless Tobacco Never  ? ? ?Labs: ?Review Flowsheet   ? ?    ? View : No data to display.  ?  ?  ?  ?  ?  ? ? ?Capillary Blood Glucose: ?No results found for: GLUCAP ? ? ?Pulmonary Assessment Scores: ? Pulmonary Assessment Scores   ? ? [place] Name 04/08/21 1134  ?  ?  ?  ? ADL UCSD  ? ADL Phase Entry    ? SOB Score total 10    ?  ? CAT Score  ? CAT Score 10    ?  ? mMRC Score  ? mMRC Score 2    ? ?  ?  ? ?  ? ?UCSD: ?Self-administered rating of dyspnea associated with activities of daily living (ADLs) ?6-point scale (0 = "not at all" to 5 = "maximal or unable to do because of breathlessness")  ?Scoring Scores range from 0 to 120.  Minimally important difference is 5 units ? ?CAT: ?CAT can identify the health impairment of COPD patients and is better correlated with disease progression.  ?CAT has a scoring range of zero to 40. The CAT score is classified into four groups of low (less than 10), medium (10 - 20), high (  21-30) and very high (31-40) based on the impact level of disease on health status. A CAT score over 10 suggests significant symptoms.  A worsening CAT score could be explained by an exacerbation, poor medication adherence, poor inhaler technique, or progression of COPD or comorbid conditions.  ?CAT MCID is 2 points ? ?mMRC: ?mMRC (Modified Medical Research Council) Dyspnea Scale is used to assess the degree of baseline functional disability in patients of respiratory disease due to dyspnea. ?No minimal important difference is established. A decrease in score of 1 point or greater is considered a positive change.  ? ?Pulmonary Function Assessment: ? Pulmonary Function Assessment - 04/08/21 1245   ? ?  ? Breath  ? Bilateral Breath Sounds Clear   ? Shortness of Breath Yes;Limiting activity;Fear of Shortness of Breath   ? ?  ?  ? ?   ? ? ?Exercise Target Goals: ?Exercise Program Goal: ?Individual exercise prescription set using results from initial 6 min walk test and THRR while considering  patient?s activity barriers and safety.  ? ?Exercise Prescription Goal: ?Initial exercise prescription builds to 30-45 minutes a day of aerobic activity, 2-3 days per week.  Home exercise guidelines will be given to patient during program as part of exercise prescription that the participant will acknowledge. ? ?Activity Barriers & Risk Stratification: ? Activity Barriers & Cardiac Risk Stratification - 04/08/21 1114   ? ?  ? Activity Barriers & Cardiac Risk Stratification  ? Activity Barriers Shortness of Breath;Muscular Weakness;Deconditioning;Neck/Spine Problems   ? Cardiac Risk Stratification Low   ? ?  ?  ? ?  ? ? ?6 Minute Walk: ? 6 Minute Walk   ? ? [place] Name 04/08/21 1239  ?  ?  ?  ? 6 Minute Walk  ? Phase Initial    ? Distance 1760 feet    ? Walk Time 6 minutes    ? # of Rest Breaks 0    ? MPH 3.33    ? METS 4.13    ? RPE 11    ? Perceived Dyspnea  1    ? VO2 Peak 14.44    ? Symptoms No    ? Resting HR 80 bpm    ? Resting BP 118/64    ? Resting Oxygen Saturation  96 %    ? Exercise Oxygen Saturation  during 6 min walk 93 %    ? Max Ex. HR 125 bpm    ? Max Ex. BP 134/68    ? 2 Minute Post BP 116/64    ?  ? Interval HR  ? 1 Minute HR 84    ? 2 Minute HR 121    ? 3 Minute HR 122    ? 4 Minute HR 121    ? 5 Minute HR 121    ? 6 Minute HR 125    ? 2 Minute Post HR 90    ? Interval Heart Rate? Yes    ?  ? Interval Oxygen  ? Interval Oxygen? Yes    ? Baseline Oxygen Saturation % 96 %    ? 1 Minute Oxygen Saturation % 93 %    ? 1 Minute Liters of Oxygen 0 L    ? 2 Minute Oxygen Saturation % 94 %    ? 2 Minute Liters of Oxygen 0 L    ? 3 Minute Oxygen Saturation % 96 %    ? 3 Minute Liters of Oxygen 0 L    ? 4 Minute  Oxygen Saturation % 94 %    ? 4 Minute Liters of Oxygen 0 L    ? 5 Minute Oxygen Saturation % 95 %    ? 5 Minute Liters of Oxygen 0 L    ? 6  Minute Oxygen Saturation % 95 %    ? 6 Minute Liters of Oxygen 0 L    ? 2 Minute Post Oxygen Saturation % 97 %    ? 2 Minute Post Liters of Oxygen 0 L    ? ?  ?  ? ?  ? ? ?Oxygen Initial Assessment: ? Oxygen Initial Assessment - 04/08/21 1111   ? ?  ? Home Oxygen  ? Home Oxygen Device None   ? Sleep Oxygen Prescription None   ? Home Exercise Oxygen Prescription None   ? Home Resting Oxygen Prescription None   ?  ? Initial 6 min Walk  ? Oxygen Used None   ?  ? Program Oxygen Prescription  ? Program Oxygen Prescription None   ?  ? Intervention  ? Short Term Goals To learn and exhibit compliance with exercise, home and travel O2 prescription;To learn and understand importance of monitoring SPO2 with pulse oximeter and demonstrate accurate use of the pulse oximeter.;To learn and understand importance of maintaining oxygen saturations>88%;To learn and demonstrate proper pursed lip breathing techniques or other breathing techniques. ;To learn and demonstrate proper use of respiratory medications   ? Long  Term Goals Exhibits compliance with exercise, home  and travel O2 prescription;Verbalizes importance of monitoring SPO2 with pulse oximeter and return demonstration;Maintenance of O2 saturations>88%;Exhibits proper breathing techniques, such as pursed lip breathing or other method taught during program session;Compliance with respiratory medication;Demonstrates proper use of MDI?s   ? ?  ?  ? ?  ? ? ?Oxygen Re-Evaluation: ? Oxygen Re-Evaluation   ? ? [place] Name 04/18/21 0848  ?  ?  ?  ?  ?  ? Program Oxygen Prescription  ? Program Oxygen Prescription None      ?  ? Home Oxygen  ? Home Oxygen Device None      ? Sleep Oxygen Prescription None      ? Home Exercise Oxygen Prescription None      ? Home Resting Oxygen Prescription None      ?  ? Goals/Expected Outcomes  ? Short Term Goals To learn and exhibit compliance with exercise, home and travel O2 prescription;To learn and understand importance of monitoring SPO2 with  pulse oximeter and demonstrate accurate use of the pulse oximeter.;To learn and understand importance of maintaining oxygen saturations>88%;To learn and demonstrate proper pursed lip breathing techniques or

## 2021-04-25 ENCOUNTER — Encounter (HOSPITAL_COMMUNITY)
Admission: RE | Admit: 2021-04-25 | Discharge: 2021-04-25 | Disposition: A | Discharge status: Home / Self Care | Payer: BC Managed Care – PPO | Source: Ambulatory Visit | Attending: Internal Medicine | Admitting: Internal Medicine

## 2021-04-25 DIAGNOSIS — U099 Post covid-19 condition, unspecified: Secondary | ICD-10-CM

## 2021-04-25 DIAGNOSIS — R0609 Other forms of dyspnea: Secondary | ICD-10-CM | POA: Diagnosis not present

## 2021-04-25 NOTE — Progress Notes (Signed)
Daily Session Note ? ?Patient Details  ?Name: Mary Orr ?MRN: [id number] ?Date of Birth: 1955-12-13 ?Referring Provider:   ?Flowsheet Row Pulmonary Rehab Walk Test from 04/08/2021 in [place]  ?Referring Provider Ramaswamy  ? ?  ? ? ?Encounter Date: 04/25/2021 ? ?Check In: ? Session Check In - 04/25/21 1216   ? ?  ? Check-In  ? Supervising physician immediately available to respond to emergencies [facility] immediately available   ? Physician(s) [place] Pink   ? Location [facility]   ? Staff Present Elmon Else, MS, ACSM-CEP, Exercise Physiologist;Daveyon Kitchings Ysidro Evert, RN;Carlette Wilber Oliphant, RN, BSN   ? Virtual Visit No   ? Medication changes reported     No   ? Fall or balance concerns reported    No   ? Tobacco Cessation No Change   ? Warm-up and Cool-down Performed as group-led instruction   ? Resistance Training Performed Yes   ? VAD Patient? No   ? PAD/SET Patient? No   ?  ? Pain Assessment  ? Currently in Pain? No/denies   ? Multiple Pain Sites No   ? ?  ?  ? ?  ? ? ?Capillary Blood Glucose: ?No results found for this or any previous visit (from the past 24 hour(s)). ? ? ? ?Social History  ? ?Tobacco Use  ?Smoking Status Never  ?Smokeless Tobacco Never  ? ? ?Goals Met:  ?Exercise tolerated well ?No report of concerns or symptoms today ?Strength training completed today ? ?Goals Unmet:  ?Not Applicable ? ?Comments: Service time is from 1025 to 1143 ? ? ? ?Dr. Rodman Pickle is Medical Director for Pulmonary Rehab at [facility].  ?

## 2021-04-27 IMAGING — CT CT CHEST HIGH RESOLUTION W/O CM
2 of 7 series · 13 of 36 positions shown, 16 images · non-contrast
Comparison: 07/26/2018.

CLINICAL DATA: Chronic cough since COVID in September 2018.

EXAM:
CT CHEST WITHOUT CONTRAST
TECHNIQUE: Multidetector CT imaging of the chest was performed following the
standard protocol without intravenous contrast. High resolution
imaging of the lungs, as well as inspiratory and expiratory imaging,
was performed.

[Series 2: thorax · axial · 0.66mm/px · z∈[-295,-43]mm · 10 of 155 slices shown, 13 images]
[im 15/155  mediastinal]
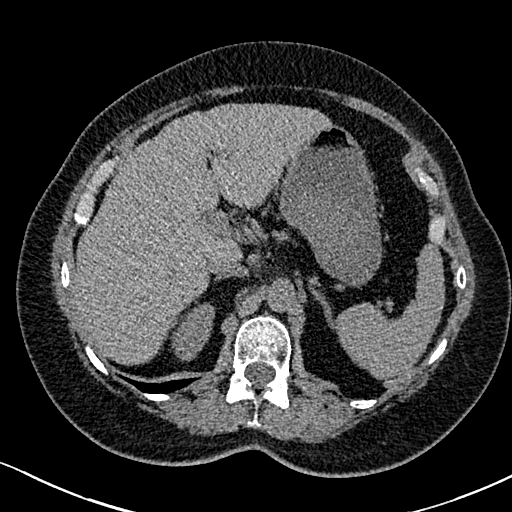
[im 15/155  lung]
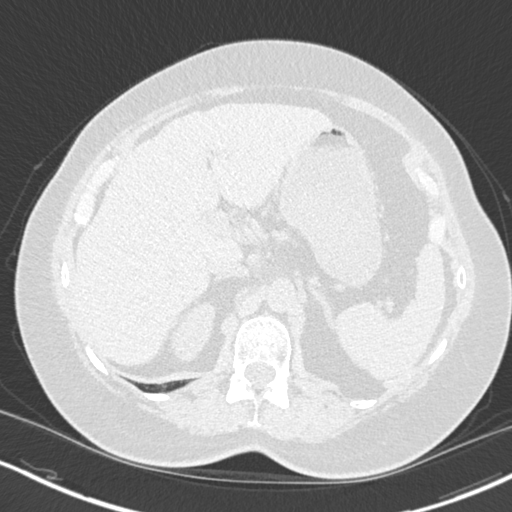
[im 29/155  lung]
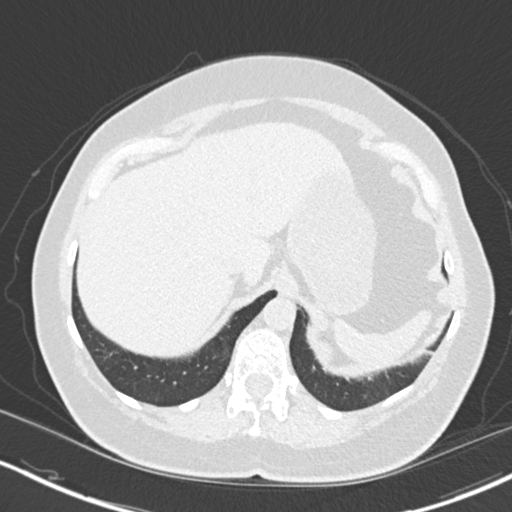
[im 43/155  lung]
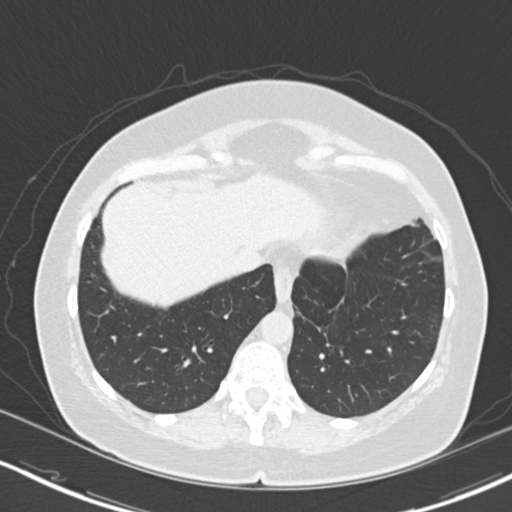
[im 57/155  lung]
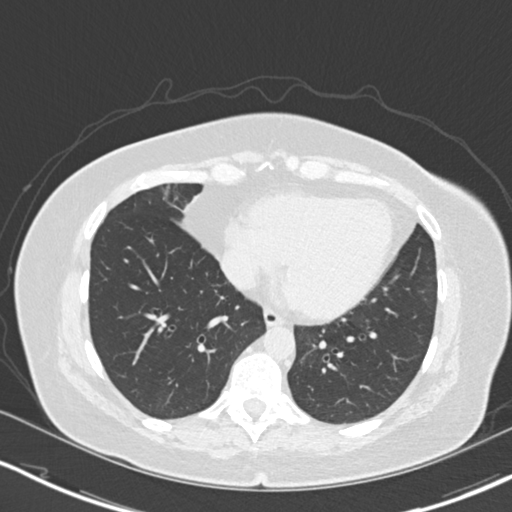
[im 71/155  mediastinal]
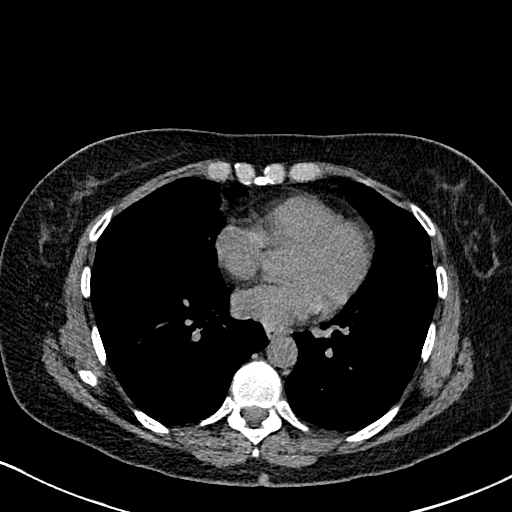
[im 71/155  lung]
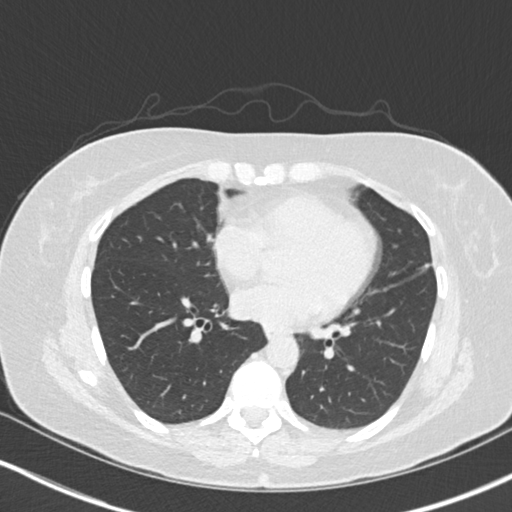
[im 85/155  lung]
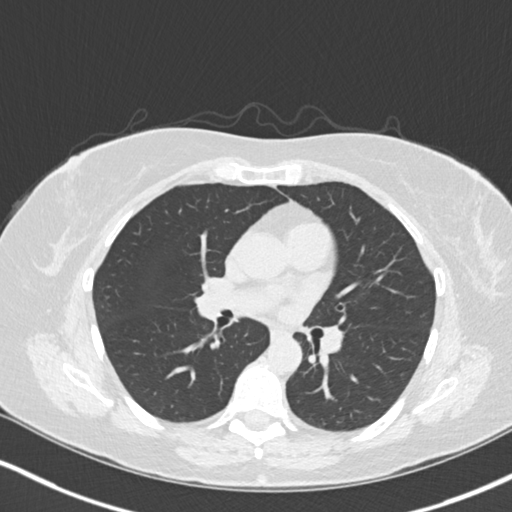
[im 99/155  lung]
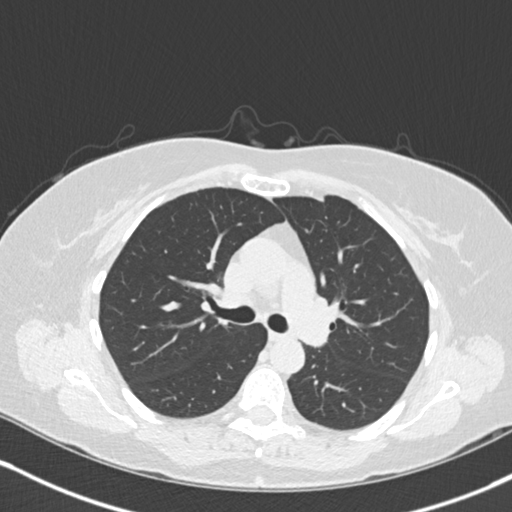
[im 113/155  lung]
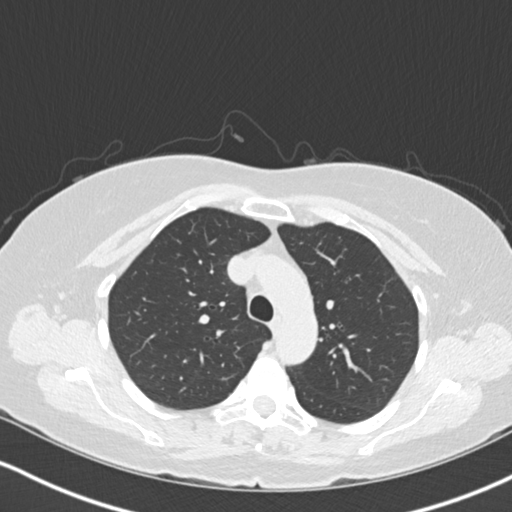
[im 127/155  mediastinal]
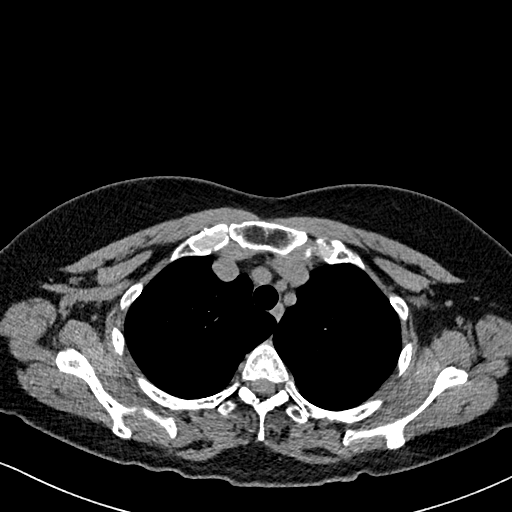
[im 127/155  lung]
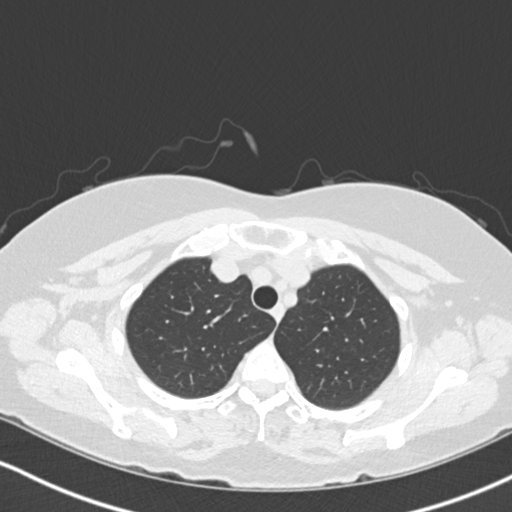
[im 141/155  lung]
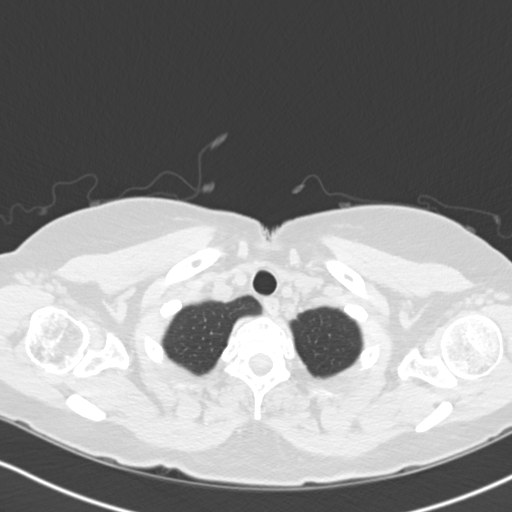

[Series 8: coronal · coronal · 0.64mm/px · 3 of 122 slices shown]
[im 25/122  lung]
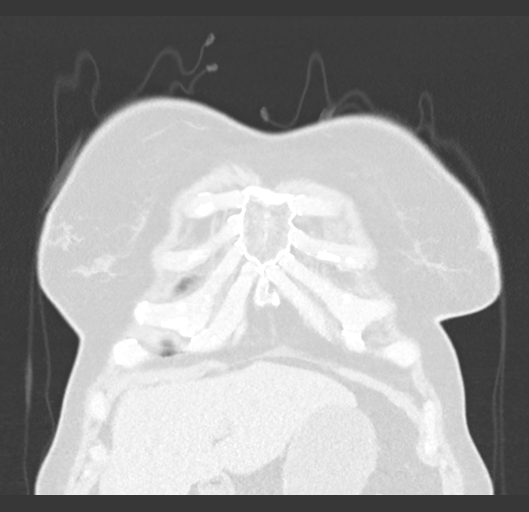
[im 49/122  lung]
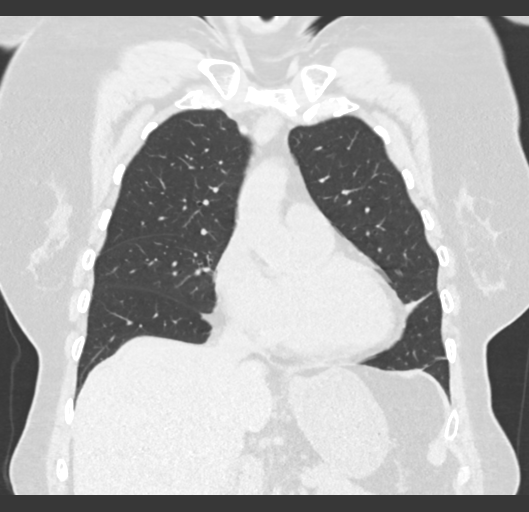
[im 73/122  lung]
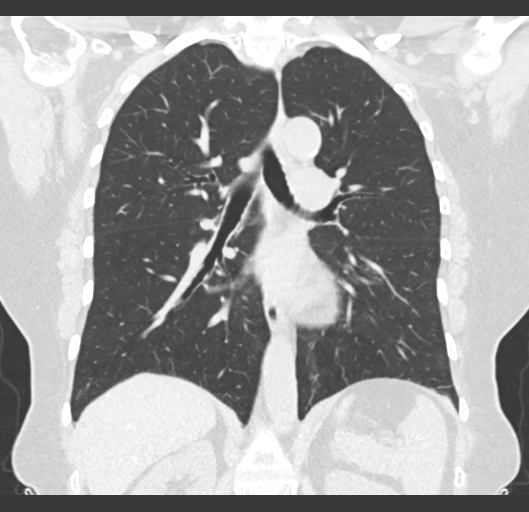

[13 of 36 positions shown; findings below may reference images not displayed]

FINDINGS: Cardiovascular: Atherosclerotic calcification of the aorta. Heart is
at the upper limits of normal in size. No pericardial effusion.

Mediastinum/Nodes: No pathologically enlarged mediastinal or
axillary lymph nodes. Hilar regions are difficult to evaluate
without IV contrast. Esophagus is grossly unremarkable.

Lungs/Pleura: Linear scarring in the posterior right upper lobe.
Mild central bronchiectasis. Negative for subpleural reticulation,
traction bronchiectasis/bronchiolectasis, architectural distortion
or honeycombing. Faint areas of subpleural ground-glass in the left
lower lobe. Lungs are otherwise clear. No pleural fluid. Airway is
unremarkable. Mild air trapping.

Upper Abdomen: Liver margin is slightly irregular. Subcentimeter
low-attenuation lesions in the right hepatic lobe are too small to
characterize. Cholecystectomy. Visualized portions of the adrenal
glands, kidneys, spleen pancreas and stomach are grossly
unremarkable.

Musculoskeletal: No worrisome lytic or sclerotic lesions.
IMPRESSION: 1. Faint subpleural ground-glass in the left lower lobe is
nonspecific but may reflect subtle post [id number]-G4 inflammatory
fibrosis. Otherwise, no evidence of interstitial lung disease.
2. Mild air trapping is indicative of small airways disease.
3. Marginal irregularity of the liver is indicative of cirrhosis.
4.  Aortic atherosclerosis ([id number]).

## 2021-04-30 ENCOUNTER — Encounter (HOSPITAL_COMMUNITY)
Admission: RE | Admit: 2021-04-30 | Discharge: 2021-04-30 | Disposition: A | Discharge status: Home / Self Care | Payer: BC Managed Care – PPO | Source: Ambulatory Visit | Attending: Internal Medicine | Admitting: Internal Medicine

## 2021-04-30 VITALS — Wt 166.9 lb

## 2021-04-30 DIAGNOSIS — U099 Post covid-19 condition, unspecified: Secondary | ICD-10-CM | POA: Diagnosis not present

## 2021-04-30 DIAGNOSIS — R0609 Other forms of dyspnea: Secondary | ICD-10-CM | POA: Diagnosis present

## 2021-04-30 NOTE — Progress Notes (Signed)
Daily Session Note ? ?Patient Details  ?Name: Mary Orr ?MRN: [id number] ?Date of Birth: [birth year] ?Referring Provider:   ?Flowsheet Row Pulmonary Rehab Walk Test from 04/08/2021 in [place]  ?Referring Provider Ramaswamy  ? ?  ? ? ?Encounter Date: 04/30/2021 ? ?Check In: ? Session Check In - 04/30/21 1157   ? ?  ? Check-In  ? Supervising physician immediately available to respond to emergencies [facility] immediately available   ? Physician(s) [place] Pink   ? [facility]   ? Staff Present Elmon Else, MS, ACSM-CEP, Exercise Physiologist;Joell Buerger Ysidro Evert, RN;Carlette Wilber Oliphant, RN, BSN   ? Virtual Visit No   ? Medication changes reported     No   ? Fall or balance concerns reported    No   ? Tobacco Cessation No Change   ? Warm-up and Cool-down Performed as group-led instruction   ? Resistance Training Performed Yes   ? VAD Patient? No   ? PAD/SET Patient? No   ?  ? Pain Assessment  ? Currently in Pain? No/denies   ? Multiple Pain Sites No   ? ?  ?  ? ?  ? ? ?Capillary Blood Glucose: ?No results found for this or any previous visit (from the past 24 hour(s)). ? ? Exercise Prescription Changes - 04/30/21 1200   ? ?  ? Response to Exercise  ? Blood Pressure (Admit) 112/70   ? Blood Pressure (Exercise) 122/84   ? Blood Pressure (Exit) 104/82   ? Heart Rate (Admit) 79 bpm   ? Heart Rate (Exercise) 115 bpm   ? Heart Rate (Exit) 96 bpm   ? Oxygen Saturation (Admit) 94 %   ? Oxygen Saturation (Exercise) 93 %   ? Oxygen Saturation (Exit) 95 %   ? Rating of Perceived Exertion (Exercise) 11   ? Perceived Dyspnea (Exercise) 0   ? Duration Continue with 30 min of aerobic exercise without signs/symptoms of physical distress.   ? Intensity THRR unchanged   ?  ? Progression  ? Progression Continue to progress workloads to maintain intensity without signs/symptoms of physical distress.   ?  ? Resistance Training  ? Training Prescription Yes   ? Weight Blue bands   ? Reps  10-15   ? Time 10 Minutes   ?  ? Treadmill  ? MPH 2   ? Grade 0.5   ? Minutes 15   ?  ? NuStep  ? Level 3   ? SPM 80   ? Minutes 15   ? METs 2.4   ? ?  ?  ? ?  ? ? ?Social History  ? ?Tobacco Use  ?Smoking Status Never  ?Smokeless Tobacco Never  ? ? ?Goals Met:  ?Exercise tolerated well ?No report of concerns or symptoms today ?Strength training completed today ? ?Goals Unmet:  ?Not Applicable.serv ? ? ?Comments: Service time is from 1028 to 1139 ? ? ? ?Dr. Rodman Pickle is Medical Director for Pulmonary Rehab at [facility].  ?

## 2021-05-02 ENCOUNTER — Encounter (HOSPITAL_COMMUNITY)
Admission: RE | Admit: 2021-05-02 | Discharge: 2021-05-02 | Disposition: A | Discharge status: Home / Self Care | Payer: BC Managed Care – PPO | Source: Ambulatory Visit | Attending: Internal Medicine | Admitting: Internal Medicine

## 2021-05-02 DIAGNOSIS — R0609 Other forms of dyspnea: Secondary | ICD-10-CM

## 2021-05-02 DIAGNOSIS — U099 Post covid-19 condition, unspecified: Secondary | ICD-10-CM

## 2021-05-02 NOTE — Progress Notes (Signed)
Daily Session Note ? ?Patient Details  ?Name: Mary Orr ?MRN: [id number] ?Date of Birth: Jan 27, 1956 ?Referring Provider:   ?Flowsheet Row Pulmonary Rehab Walk Test from 04/08/2021 in [place]  ?Referring Provider Ramaswamy  ? ?  ? ? ?Encounter Date: 05/02/2021 ? ?Check In: ? Session Check In - 05/02/21 1158   ? ?  ? Check-In  ? Supervising physician immediately available to respond to emergencies [facility] immediately available   ? Physician(s) Dr. Tawanna Solo   ? Location [facility]   ? Staff Present Rosebud Poles, RN, Quentin Ore, MS, ACSM-CEP, Exercise Physiologist;Lisa Ysidro Evert, RN   ? Virtual Visit No   ? Medication changes reported     No   ? Fall or balance concerns reported    No   ? Tobacco Cessation No Change   ? Warm-up and Cool-down Performed as group-led instruction   ? Resistance Training Performed Yes   ? VAD Patient? No   ? PAD/SET Patient? No   ?  ? Pain Assessment  ? Currently in Pain? No/denies   ? Multiple Pain Sites No   ? ?  ?  ? ?  ? ? ?Capillary Blood Glucose: ?No results found for this or any previous visit (from the past 24 hour(s)). ? ? ? ?Social History  ? ?Tobacco Use  ?Smoking Status Never  ?Smokeless Tobacco Never  ? ? ?Goals Met:  ?Proper associated with RPD/PD & O2 Sat ?Independence with exercise equipment ?Exercise tolerated well ?No report of concerns or symptoms today ?Strength training completed today ? ?Goals Unmet:  ?Not Applicable ? ?Comments: Service time is from 1020 to 1129.  ? ? ?Dr. Rodman Pickle is Medical Director for Pulmonary Rehab at [facility]  ?

## 2021-05-07 ENCOUNTER — Encounter (HOSPITAL_COMMUNITY): Payer: BC Managed Care – PPO

## 2021-05-09 ENCOUNTER — Telehealth (HOSPITAL_COMMUNITY): Payer: Self-pay | Admitting: Family Medicine

## 2021-05-09 ENCOUNTER — Encounter (HOSPITAL_COMMUNITY): Payer: BC Managed Care – PPO

## 2021-05-14 ENCOUNTER — Encounter (HOSPITAL_COMMUNITY)
Admission: RE | Admit: 2021-05-14 | Discharge: 2021-05-14 | Disposition: A | Discharge status: Home / Self Care | Payer: BC Managed Care – PPO | Source: Ambulatory Visit | Attending: Internal Medicine | Admitting: Internal Medicine

## 2021-05-14 VITALS — Wt 168.2 lb

## 2021-05-14 DIAGNOSIS — R0609 Other forms of dyspnea: Secondary | ICD-10-CM

## 2021-05-14 DIAGNOSIS — U099 Post covid-19 condition, unspecified: Secondary | ICD-10-CM

## 2021-05-14 NOTE — Progress Notes (Signed)
Mary Orr 66 y.o. female ? ?Nutrition Note ?She is motivated to make lifestyle changes to aid with pulmonary rehab. Patient has medical history of post covid 19 syndrome, dyspnea ILD, GERD. She lives at home with her husband. She is motivated to lose about 30#. She does the majority of the grocery shopping and cooking. She reports husband recently had major back surgery and also needs to lose weight.  ? ?RMR: 1281kcals  (x1.3, 1665kcals) ? ?Nutrition Diagnosis ?Overweight related to excessive energy intake as evidenced by a 29.8 ? ?Nutrition Intervention ?Pt?s individual nutrition plan reviewed with pt. ?Benefits of adopting Heart Healthy diet discussed.   ?Continue client-centered nutrition education by RD, as part of interdisciplinary care. ? ?Monitor/Evaluation: ?Patient reports motivation to make lifestyle changes to aid with weight loss We discussed the plate method as a strategy for weight loss, increasing non-starchy vegetables, portion sizes, and tracking intake. Handouts/notes given. Patient amicable to RD suggestions and verbalizes understanding. Will follow-up as needed.  ? ?10 minutes spent in review of topics related to a heart healthy diet including sodium intake, blood sugar control, weight management, and fiber intake. ? ?Goal(s) ?Pt to identify and limit food sources of saturated fat, trans fat, refined carbohydrates and sodium ?Pt to identify food quantities necessary to achieve weight loss of 6-24 lb at graduation from cardiac rehab.  ?Pt able to name foods that affect blood glucose. Continue to limit simple sugars, refined carbohydrates, sugary beverages, etc.  ?Pt to describe the benefit of including lean protein/plant proteins, fruits, vegetables, whole grains, nuts/seeds, and low-fat dairy products in a heart healthy meal plan. ?Pt to practice mindful and intuitive eating exercises ? ?Plan:  ?Will provide client-centered nutrition education as part of interdisciplinary care ?Monitor and  evaluate progress toward nutrition goal with team. ? ? ?Mary Mast [place], MS, RDN, LDN ? ?

## 2021-05-14 NOTE — Progress Notes (Signed)
Daily Session Note ? ?Patient Details  ?Name: Mary Orr ?MRN: [id number] ?Date of Birth: August 04, 1955 ?Referring Provider:   ?Flowsheet Row Pulmonary Rehab Walk Test from 04/08/2021 in [place]  ?Referring Provider Ramaswamy  ? ?  ? ? ?Encounter Date: 05/14/2021 ? ?Check In: ? Session Check In - 05/14/21 1127   ? ?  ? Check-In  ? Supervising physician immediately available to respond to emergencies [facility] immediately available   ? Physician(s) Dr. Pietro Cassis   ? Location [facility]   ? Staff Present Rosebud Poles, RN, Luisa Hart, RN, BSN;Lisa Ysidro Evert, Cathleen Fears, MS, ACSM-CEP, Exercise Physiologist   ? Virtual Visit No   ? Medication changes reported     No   ? Fall or balance concerns reported    No   ? Tobacco Cessation No Change   ? Warm-up and Cool-down Performed as group-led instruction   ? Resistance Training Performed Yes   ? VAD Patient? No   ? PAD/SET Patient? No   ?  ? Pain Assessment  ? Currently in Pain? No/denies   ? Multiple Pain Sites No   ? ?  ?  ? ?  ? ? ?Capillary Blood Glucose: ?No results found for this or any previous visit (from the past 24 hour(s)). ? ? ? ?Social History  ? ?Tobacco Use  ?Smoking Status Never  ?Smokeless Tobacco Never  ? ? ?Goals Met:  ?Proper associated with RPD/PD & O2 Sat ?Exercise tolerated well ?No report of concerns or symptoms today ?Strength training completed today ? ?Goals Unmet:  ?Not Applicable ? ?Comments: Service time is from 1020 to 1145 ? ? ? ?Dr. Rodman Pickle is Medical Director for Pulmonary Rehab at [facility]  ?

## 2021-05-16 ENCOUNTER — Encounter (HOSPITAL_COMMUNITY)
Admission: RE | Admit: 2021-05-16 | Discharge: 2021-05-16 | Disposition: A | Discharge status: Home / Self Care | Payer: BC Managed Care – PPO | Source: Ambulatory Visit | Attending: Internal Medicine | Admitting: Internal Medicine

## 2021-05-16 DIAGNOSIS — R0609 Other forms of dyspnea: Secondary | ICD-10-CM | POA: Diagnosis not present

## 2021-05-16 DIAGNOSIS — U099 Post covid-19 condition, unspecified: Secondary | ICD-10-CM

## 2021-05-16 NOTE — Progress Notes (Signed)
Home Exercise Prescription ?I have reviewed a Home Exercise Prescription with Lucius Conn. Imanii is not currently exercising at home. I encouraged to try walking 1 non-rehab day/wk for 30 min. I told her to walk at a leisurely pace and to take breaks as needed. Hazel has an Apple watch. I asked her to try walking this weekend and tracking her walk using Apple watch so that I can see the data. She agreed with my recommendations. Rutvi is very motivated to exercise. She has days wear her symptoms flare up, and she cannot do anything. I told her that she should take it low and slow and progress from there.The patient stated that their goals were to her flare up days. We reviewed exercise guidelines, target heart rate during exercise, RPE Scale, weather conditions, endpoints for exercise, warmup and cool down. The patient is encouraged to come to me with any questions. I will continue to follow up with the patient to assist them with progression and safety.   ? ?Sheppard Plumber, MS, ACSM-CEP ?05/16/2021 ?3:03 PM  ?

## 2021-05-16 NOTE — Progress Notes (Signed)
Daily Session Note ? ?Patient Details  ?Name: Mary Orr ?MRN: [id number] ?Date of Birth: [birth year] ?Referring Provider:   ?Flowsheet Row Pulmonary Rehab Walk Test from 04/08/2021 in [place]  ?Referring Provider Ramaswamy  ? ?  ? ? ?Encounter Date: 05/16/2021 ? ?Check In: ? Session Check In - 05/16/21 1125   ? ?  ? Check-In  ? Supervising physician immediately available to respond to emergencies [facility] immediately available   ? Physician(s) Dr. Pietro Cassis   ? Location [facility]   ? Staff Present Rosebud Poles, RN, BSN;Carlette Wilber Oliphant, RN, Quentin Ore, MS, ACSM-CEP, Exercise Physiologist;Lisa Ysidro Evert, RN   ? Virtual Visit No   ? Medication changes reported     No   ? Fall or balance concerns reported    No   ? Tobacco Cessation No Change   ? Warm-up and Cool-down Performed as group-led instruction   ? Resistance Training Performed Yes   ? VAD Patient? No   ? PAD/SET Patient? No   ?  ? Pain Assessment  ? Currently in Pain? No/denies   ? Multiple Pain Sites No   ? ?  ?  ? ?  ? ? ?Capillary Blood Glucose: ?No results found for this or any previous visit (from the past 24 hour(s)). ? ? ? ?Social History  ? ?Tobacco Use  ?Smoking Status Never  ?Smokeless Tobacco Never  ? ? ?Goals Met:  ?Proper associated with RPD/PD & O2 Sat ?Exercise tolerated well ?No report of concerns or symptoms today ?Strength training completed today ? ?Goals Unmet:  ?Not Applicable ? ?Comments: Service time is from 1026 to 1140 ? ? ? ?Dr. Rodman Pickle is Medical Director for Pulmonary Rehab at [facility].  ?

## 2021-05-21 ENCOUNTER — Encounter (HOSPITAL_COMMUNITY)
Admission: RE | Admit: 2021-05-21 | Discharge: 2021-05-21 | Disposition: A | Discharge status: Home / Self Care | Payer: BC Managed Care – PPO | Source: Ambulatory Visit | Attending: Internal Medicine | Admitting: Internal Medicine

## 2021-05-21 DIAGNOSIS — U099 Post covid-19 condition, unspecified: Secondary | ICD-10-CM

## 2021-05-21 DIAGNOSIS — R0609 Other forms of dyspnea: Secondary | ICD-10-CM

## 2021-05-21 NOTE — Progress Notes (Signed)
Daily Session Note ? ?Patient Details  ?Name: Mary Orr ?MRN: [id number] ?Date of Birth: 10/14/1955 ?Referring Provider:   ?Flowsheet Row Pulmonary Rehab Walk Test from 04/08/2021 in [place]  ?Referring Provider Ramaswamy  ? ?  ? ? ?Encounter Date: 05/21/2021 ? ?Check In: ? Session Check In - 05/21/21 1158   ? ?  ? Check-In  ? Supervising physician immediately available to respond to emergencies [facility] immediately available   ? Physician(s) Dr. Pietro Cassis   ? [facility]   ? Staff Present Rosebud Poles, RN, BSN;Carlette Wilber Oliphant, RN, Quentin Ore, MS, ACSM-CEP, Exercise Physiologist;Jonessa Triplett Ysidro Evert, RN   ? Virtual Visit No   ? Medication changes reported     No   ? Fall or balance concerns reported    No   ? Tobacco Cessation No Change   ? Warm-up and Cool-down Performed as group-led instruction   ? Resistance Training Performed Yes   ? VAD Patient? No   ? PAD/SET Patient? No   ?  ? Pain Assessment  ? Currently in Pain? No/denies   ? Multiple Pain Sites No   ? ?  ?  ? ?  ? ? ?Capillary Blood Glucose: ?No results found for this or any previous visit (from the past 24 hour(s)). ? ? ? ?Social History  ? ?Tobacco Use  ?Smoking Status Never  ?Smokeless Tobacco Never  ? ? ?Goals Met:  ?Proper associated with RPD/PD & O2 Sat ?Exercise tolerated well ?No report of concerns or symptoms today ?Strength training completed today ? ?Goals Unmet:  ?Not Applicable ? ?Comments: Service time is from 1021 to 1135 ? ? ? ?Dr. Rodman Pickle is Medical Director for Pulmonary Rehab at [facility].  ?

## 2021-05-22 NOTE — Progress Notes (Signed)
Pulmonary Individual Treatment Plan ? ?Patient Details  ?Name: Mary Orr ?MRN: [id number] ?Date of Birth: [birth year] ?Referring Provider:   ?Flowsheet Row Pulmonary Rehab Walk Test from 04/08/2021 in [place]  ?Referring Provider Ramaswamy  ? ?  ? ? ?Initial Encounter Date:  ?Flowsheet Row Pulmonary Rehab Walk Test from 04/08/2021 in [place]  ?Date 04/08/21  ? ?  ? ? ?Visit Diagnosis: Dyspnea on exertion ? ?Post-COVID-19 syndrome ? ?Patient's Home Medications on Admission:  ? ?Current Outpatient Medications:  ?  albuterol (PROVENTIL) (2.5 MG/3ML) 0.083% nebulizer solution, Take by nebulization., Disp: , Rfl:  ?  albuterol (VENTOLIN HFA) 108 (90 Base) MCG/ACT inhaler, Inhale 2 puffs into the lungs every 6 (six) hours as needed., Disp: , Rfl:  ?  aspirin 81 MG EC tablet, Take by mouth., Disp: , Rfl:  ?  azelastine (ASTELIN) 0.1 % nasal spray, 1 spray., Disp: , Rfl:  ?  buPROPion (WELLBUTRIN XL) 150 MG 24 hr tablet, TAKE 1 TABLET(150 MG) BY MOUTH DAILY, Disp: , Rfl:  ?  chlorpheniramine (ALLER-CHLOR) 4 MG tablet, TAKE 1-2 TABLETS AT NIGHT - FOR MANAGEMENT OF ALLERGIES AND POSTNASAL DRIP AT NIGHT, Disp: 60 tablet, Rfl: 3 ?  Diclofenac Sodium CR 100 MG 24 hr tablet, Take by mouth., Disp: , Rfl:  ?  EPINEPHrine 0.3 mg/0.3 mL IJ SOAJ injection, Inject 0.3 mLs (0.3 mg total) into the muscle as needed for anaphylaxis., Disp: 1 each, Rfl: 0 ?  fluticasone furoate-vilanterol (BREO ELLIPTA) 100-25 MCG/INH AEPB, Inhale 1 puff into the lungs daily. (Patient not taking: Reported on 04/08/2021), Disp: 14 each, Rfl: 0 ?  levocetirizine (XYZAL) 5 MG tablet, Take by mouth., Disp: , Rfl:  ?  levothyroxine (SYNTHROID) 50 MCG tablet, Take by mouth., Disp: , Rfl:  ?  Multiple Vitamin (MULTIVITAMIN) capsule, Take 1 capsule by mouth daily., Disp: , Rfl:  ?  pantoprazole (PROTONIX) 20 MG tablet, Take 1 tablet (20 mg total) by mouth 2 (two) times daily before a meal., Disp:  60 tablet, Rfl: 3 ? ?Past Medical History: ?Past Medical History:  ?Diagnosis Date  ? Thyroid disease   ? ? ?Tobacco Use: ?Social History  ? ?Tobacco Use  ?Smoking Status Never  ?Smokeless Tobacco Never  ? ? ?Labs: ?Review Flowsheet   ? ?    ? View : No data to display.  ?  ?  ?  ?  ?  ? ? ?Capillary Blood Glucose: ?No results found for: GLUCAP ? ? ?Pulmonary Assessment Scores: ? Pulmonary Assessment Scores   ? ? [place] Name 04/08/21 1134  ?  ?  ?  ? ADL UCSD  ? ADL Phase Entry    ? SOB Score total 10    ?  ? CAT Score  ? CAT Score 10    ?  ? mMRC Score  ? mMRC Score 2    ? ?  ?  ? ?  ? ?UCSD: ?Self-administered rating of dyspnea associated with activities of daily living (ADLs) ?6-point scale (0 = "not at all" to 5 = "maximal or unable to do because of breathlessness")  ?Scoring Scores range from 0 to 120.  Minimally important difference is 5 units ? ?CAT: ?CAT can identify the health impairment of COPD patients and is better correlated with disease progression.  ?CAT has a scoring range of zero to 40. The CAT score is classified into four groups of low (less than 10), medium (10 - 20), high (  21-30) and very high (31-40) based on the impact level of disease on health status. A CAT score over 10 suggests significant symptoms.  A worsening CAT score could be explained by an exacerbation, poor medication adherence, poor inhaler technique, or progression of COPD or comorbid conditions.  ?CAT MCID is 2 points ? ?mMRC: ?mMRC (Modified Medical Research Council) Dyspnea Scale is used to assess the degree of baseline functional disability in patients of respiratory disease due to dyspnea. ?No minimal important difference is established. A decrease in score of 1 point or greater is considered a positive change.  ? ?Pulmonary Function Assessment: ? Pulmonary Function Assessment - 04/08/21 1245   ? ?  ? Breath  ? Bilateral Breath Sounds Clear   ? Shortness of Breath Yes;Limiting activity;Fear of Shortness of Breath   ? ?  ?  ? ?   ? ? ?Exercise Target Goals: ?Exercise Program Goal: ?Individual exercise prescription set using results from initial 6 min walk test and THRR while considering  patient?s activity barriers and safety.  ? ?Exercise Prescription Goal: ?Initial exercise prescription builds to 30-45 minutes a day of aerobic activity, 2-3 days per week.  Home exercise guidelines will be given to patient during program as part of exercise prescription that the participant will acknowledge. ? ?Activity Barriers & Risk Stratification: ? Activity Barriers & Cardiac Risk Stratification - 04/08/21 1114   ? ?  ? Activity Barriers & Cardiac Risk Stratification  ? Activity Barriers Shortness of Breath;Muscular Weakness;Deconditioning;Neck/Spine Problems   ? Cardiac Risk Stratification Low   ? ?  ?  ? ?  ? ? ?6 Minute Walk: ? 6 Minute Walk   ? ? [place] Name 04/08/21 1239  ?  ?  ?  ? 6 Minute Walk  ? Phase Initial    ? Distance 1760 feet    ? Walk Time 6 minutes    ? # of Rest Breaks 0    ? MPH 3.33    ? METS 4.13    ? RPE 11    ? Perceived Dyspnea  1    ? VO2 Peak 14.44    ? Symptoms No    ? Resting HR 80 bpm    ? Resting BP 118/64    ? Resting Oxygen Saturation  96 %    ? Exercise Oxygen Saturation  during 6 min walk 93 %    ? Max Ex. HR 125 bpm    ? Max Ex. BP 134/68    ? 2 Minute Post BP 116/64    ?  ? Interval HR  ? 1 Minute HR 84    ? 2 Minute HR 121    ? 3 Minute HR 122    ? 4 Minute HR 121    ? 5 Minute HR 121    ? 6 Minute HR 125    ? 2 Minute Post HR 90    ? Interval Heart Rate? Yes    ?  ? Interval Oxygen  ? Interval Oxygen? Yes    ? Baseline Oxygen Saturation % 96 %    ? 1 Minute Oxygen Saturation % 93 %    ? 1 Minute Liters of Oxygen 0 L    ? 2 Minute Oxygen Saturation % 94 %    ? 2 Minute Liters of Oxygen 0 L    ? 3 Minute Oxygen Saturation % 96 %    ? 3 Minute Liters of Oxygen 0 L    ? 4 Minute  Oxygen Saturation % 94 %    ? 4 Minute Liters of Oxygen 0 L    ? 5 Minute Oxygen Saturation % 95 %    ? 5 Minute Liters of Oxygen 0 L    ? 6  Minute Oxygen Saturation % 95 %    ? 6 Minute Liters of Oxygen 0 L    ? 2 Minute Post Oxygen Saturation % 97 %    ? 2 Minute Post Liters of Oxygen 0 L    ? ?  ?  ? ?  ? ? ?Oxygen Initial Assessment: ? Oxygen Initial Assessment - 04/08/21 1111   ? ?  ? Home Oxygen  ? Home Oxygen Device None   ? Sleep Oxygen Prescription None   ? Home Exercise Oxygen Prescription None   ? Home Resting Oxygen Prescription None   ?  ? Initial 6 min Walk  ? Oxygen Used None   ?  ? Program Oxygen Prescription  ? Program Oxygen Prescription None   ?  ? Intervention  ? Short Term Goals To learn and exhibit compliance with exercise, home and travel O2 prescription;To learn and understand importance of monitoring SPO2 with pulse oximeter and demonstrate accurate use of the pulse oximeter.;To learn and understand importance of maintaining oxygen saturations>88%;To learn and demonstrate proper pursed lip breathing techniques or other breathing techniques. ;To learn and demonstrate proper use of respiratory medications   ? Long  Term Goals Exhibits compliance with exercise, home  and travel O2 prescription;Verbalizes importance of monitoring SPO2 with pulse oximeter and return demonstration;Maintenance of O2 saturations>88%;Exhibits proper breathing techniques, such as pursed lip breathing or other method taught during program session;Compliance with respiratory medication;Demonstrates proper use of MDI?s   ? ?  ?  ? ?  ? ? ?Oxygen Re-Evaluation: ? Oxygen Re-Evaluation   ? ? Row Name 04/18/21 0848 05/15/21 0804  ?  ?  ?  ?  ? Program Oxygen Prescription  ? Program Oxygen Prescription None None     ?  ? Home Oxygen  ? Home Oxygen Device None None     ? Sleep Oxygen Prescription None None     ? Home Exercise Oxygen Prescription None None     ? Home Resting Oxygen Prescription None None     ?  ? Goals/Expected Outcomes  ? Short Term Goals To learn and exhibit compliance with exercise, home and travel O2 prescription;To learn and understand  importance of monitoring SPO2 with pulse oximeter and demonstrate accurate use of the pulse oximeter.;To learn and understand importance of maintaining oxygen saturations>88%;To learn and demonstrate proper pur

## 2021-05-23 ENCOUNTER — Encounter (HOSPITAL_COMMUNITY)
Admission: RE | Admit: 2021-05-23 | Discharge: 2021-05-23 | Disposition: A | Discharge status: Home / Self Care | Payer: BC Managed Care – PPO | Source: Ambulatory Visit | Attending: Internal Medicine | Admitting: Internal Medicine

## 2021-05-23 DIAGNOSIS — R0609 Other forms of dyspnea: Secondary | ICD-10-CM | POA: Diagnosis not present

## 2021-05-23 DIAGNOSIS — U099 Post covid-19 condition, unspecified: Secondary | ICD-10-CM

## 2021-05-23 NOTE — Progress Notes (Signed)
Daily Session Note ? ?Patient Details  ?Name: Mary Orr ?MRN: [id number] ?Date of Birth: [birth year] ?Referring Provider:   ?Flowsheet Row Pulmonary Rehab Walk Test from 04/08/2021 in [place]  ?Referring Provider Ramaswamy  ? ?  ? ? ?Encounter Date: 05/23/2021 ? ?Check In: ? Session Check In - 05/23/21 1134   ? ?  ? Check-In  ? Supervising physician immediately available to respond to emergencies [facility] immediately available   ? Physician(s) Dr. Bonner Puna   ? Location [facility]   ? Staff Present Elmon Else, MS, ACSM-CEP, Exercise Physiologist;Jetta Gilford Rile BS, ACSM EP-C, Exercise Physiologist;Lisa Ysidro Evert, RN   ? Virtual Visit No   ? Medication changes reported     No   ? Fall or balance concerns reported    No   ? Tobacco Cessation No Change   ? Warm-up and Cool-down Performed as group-led instruction   ? Resistance Training Performed Yes   ? VAD Patient? No   ? PAD/SET Patient? No   ?  ? Pain Assessment  ? Currently in Pain? No/denies   ? Multiple Pain Sites No   ? ?  ?  ? ?  ? ? ?Capillary Blood Glucose: ?No results found for this or any previous visit (from the past 24 hour(s)). ? ? ? ?Social History  ? ?Tobacco Use  ?Smoking Status Never  ?Smokeless Tobacco Never  ? ? ?Goals Met:  ?Proper associated with RPD/PD & O2 Sat ?Exercise tolerated well ?No report of concerns or symptoms today ?Strength training completed today ? ?Goals Unmet:  ?Not Applicable ? ?Comments: Service time is from 1022 to 1140.  ? ? ?Dr. Rodman Pickle is Medical Director for Pulmonary Rehab at [facility].  ?

## 2021-05-28 ENCOUNTER — Encounter (HOSPITAL_COMMUNITY)
Admission: RE | Admit: 2021-05-28 | Discharge: 2021-05-28 | Disposition: A | Discharge status: Home / Self Care | Payer: BC Managed Care – PPO | Source: Ambulatory Visit | Attending: Internal Medicine | Admitting: Internal Medicine

## 2021-05-28 VITALS — Wt 166.9 lb

## 2021-05-28 DIAGNOSIS — R0609 Other forms of dyspnea: Secondary | ICD-10-CM | POA: Diagnosis present

## 2021-05-28 DIAGNOSIS — U099 Post covid-19 condition, unspecified: Secondary | ICD-10-CM | POA: Insufficient documentation

## 2021-05-28 NOTE — Progress Notes (Signed)
Mary Orr 67 y.o. female ?Nutrition Note ?Mary Orr is motivated to make lifestyle changes to aid with pulmonary rehab. Patient has medical history of ILD, GERD, post COVID syndrome. She is motivated to lose ~30#. She lives at home with her husband, Mary Orr. Her adult daughter, Mary Orr, was present and is also working on weight loss; she is supportive of making lifestyle changes. Mary Orr does the majority of the grocery shopping and cooking. They report a lengthy dieting history including keto diet, high protein, intermittent fasting, very low calorie, etc. She has been successful in the past weight loss; however, difficulty maintaining dieting behaviors. She also reports generally poor energy level.  ? ?Labs: WNL  ?RMR: [id number] (x1.4, 1802 kcals) ? ?24 hour recall:  ?Breakfast; 1/2 avocado, strawberries OR scrambled eggs, vegetables ?Lunch: (out to eat) Spinach salad with goat cheese ?Dinner:  steak, potato, salad  OR skips/fasts ?Snack: nuts ? ?Nutrition Diagnosis ?Overweight  related to excessive energy intake as evidenced by a 29.8 ? ?Nutrition Intervention ?Pt?s individual nutrition plan reviewed with pt. ?Benefits of adopting Heart Healthy diet discussed.  ?Continue client-centered nutrition education by RD, as part of interdisciplinary care. ? ?Monitor/Evaluation: ?Patient reports motivation to make lifestyle changes to aid with weight loss. We discussed strategies for weight loss, high protein intake, high fiber intake, fruits, vegetables, whole grains,eating frequency. We further discussed guidelines for low carbohydrate vs very low carbohydrate intake. Handouts/notes given with healthy convenience foods and quick tips.Patient amicable to RD suggestions and verbalizes understanding. Will follow-up as needed.  ? ?23 minutes spent in review of topics related to a heart healthy diet including sodium intake, blood sugar control, weight management, and fiber intake. ? ?Goal(s) ?Use the plate method as a guide for  meal planning.  ?Prioritize protein intake. Aim for ~80g protein daily to support weight loss efforts. ?Increase fiber intake to include non-starchy vegetables, whole grains, fruit, etc. ?Consider portion sizes of calorically dense foods,even healthy foods such as hummus, avocado, nuts, etc. May track intake to support increased self awareness.  ?Pt to identify and limit food sources of saturated fat, trans fat, refined carbohydrates and sodium ?Pt to identify food quantities necessary to achieve weight loss of 6-24 lb at graduation from cardiac rehab.  ?Pt able to name foods that affect blood glucose. Continue to limit simple sugars, refined carbohydrates, sugary beverages, etc.  ?Pt to describe the benefit of including lean protein/plant proteins, fruits, vegetables, whole grains, nuts/seeds, and low-fat dairy products in a heart healthy meal plan. ? ?Plan:  ?Will provide client-centered nutrition education as part of interdisciplinary care ?Monitor and evaluate progress toward nutrition goal with team. ? ? ?Mary [place], MS, RDN, LDN ? ?

## 2021-05-28 NOTE — Progress Notes (Signed)
Daily Session Note ? ?Patient Details  ?Name: Mary Orr ?MRN: [id number] ?Date of Birth: 12/02/1955 ?Referring Provider:   ?Flowsheet Row Pulmonary Rehab Walk Test from 04/08/2021 in [facility]  ?Referring Provider Ramaswamy  ? ?  ? ? ?Encounter Date: 05/28/2021 ? ?Check In: ? Session Check In - 05/28/21 1148   ? ?  ? Check-In  ? Supervising physician immediately available to respond to emergencies [facility] immediately available   ? Physician(s) Dr. Grunz   ? Location [facility]   ? Staff Present Kaylee Davis, MS, ACSM-CEP, Exercise Physiologist;Carlette Carlton, RN, BSN;Olinty Richards, MS, ACSM CEP, Exercise Physiologist;Joan Behrens, RN, BSN   ? Virtual Visit No   ? Medication changes reported     No   ? Fall or balance concerns reported    No   ? Tobacco Cessation No Change   ? Warm-up and Cool-down Performed as group-led instruction   ? Resistance Training Performed Yes   ? VAD Patient? No   ? PAD/SET Patient? No   ?  ? Pain Assessment  ? Currently in Pain? No/denies   ? Multiple Pain Sites No   ? ?  ?  ? ?  ? ? ?Capillary Blood Glucose: ?No results found for this or any previous visit (from the past 24 hour(s)). ? ? Exercise Prescription Changes - 05/28/21 1200   ? ?  ? Response to Exercise  ? Blood Pressure (Admit) 118/78   ? Blood Pressure (Exercise) 162/82   ? Blood Pressure (Exit) 106/72   ? Heart Rate (Admit) 83 bpm   ? Heart Rate (Exercise) 124 bpm   ? Heart Rate (Exit) 93 bpm   ? Oxygen Saturation (Admit) 96 %   ? Oxygen Saturation (Exercise) 95 %   ? Oxygen Saturation (Exit) 95 %   ? Rating of Perceived Exertion (Exercise) 12   ? Perceived Dyspnea (Exercise) 1   ? Duration Continue with 30 min of aerobic exercise without signs/symptoms of physical distress.   ? Intensity THRR unchanged   ?  ? Progression  ? Progression Continue to progress workloads to maintain intensity without signs/symptoms of physical distress.   ?  ? Resistance Training  ?  Training Prescription Yes   ? Weight Blue bands   ? Reps 10-15   ? Time 10 Minutes   ?  ? Treadmill  ? MPH 3.1   ? Grade 1.5   ? Minutes 15   ? METs 4.01   ?  ? NuStep  ? Level 4   ? SPM 90   ? Minutes 15   ? METs 3.1   ? ?  ?  ? ?  ? ? ?Social History  ? ?Tobacco Use  ?Smoking Status Never  ?Smokeless Tobacco Never  ? ? ?Goals Met:  ?Proper associated with RPD/PD & O2 Sat ?Independence with exercise equipment ?Exercise tolerated well ?No report of concerns or symptoms today ?Strength training completed today ? ?Goals Unmet:  ?Not Applicable ? ?Comments: Service time is from 1015 to 1134.  ? ? ?Dr. Jane Ellison is Medical Director for Pulmonary Rehab at [facility].  ?

## 2021-05-30 ENCOUNTER — Encounter (HOSPITAL_COMMUNITY)
Admission: RE | Admit: 2021-05-30 | Discharge: 2021-05-30 | Disposition: A | Discharge status: Home / Self Care | Payer: BC Managed Care – PPO | Source: Ambulatory Visit | Attending: Internal Medicine | Admitting: Internal Medicine

## 2021-05-30 DIAGNOSIS — U099 Post covid-19 condition, unspecified: Secondary | ICD-10-CM

## 2021-05-30 DIAGNOSIS — R0609 Other forms of dyspnea: Secondary | ICD-10-CM

## 2021-05-30 NOTE — Progress Notes (Signed)
Daily Session Note ? ?Patient Details  ?Name: Mary Orr ?MRN: [id number] ?Date of Birth: 1955/03/04 ?Referring Provider:   ?Flowsheet Row Pulmonary Rehab Walk Test from 04/08/2021 in [place]  ?Referring Provider Ramaswamy  ? ?  ? ? ?Encounter Date: 05/30/2021 ? ?Check In: ? Session Check In - 05/30/21 1125   ? ?  ? Check-In  ? Supervising physician immediately available to respond to emergencies [facility] immediately available   ? Physician(s) Dr. Karleen Hampshire   ? Location [facility]   ? Staff Present Rosebud Poles, RN, BSN;Carlette Wilber Oliphant, RN, Quentin Ore, MS, ACSM-CEP, Exercise Physiologist;David Makemson, MS, ACSM-CEP, CCRP, Exercise Physiologist   ? Virtual Visit No   ? Medication changes reported     No   ? Fall or balance concerns reported    No   ? Tobacco Cessation No Change   ? Warm-up and Cool-down Performed as group-led instruction   ? Resistance Training Performed Yes   ? VAD Patient? No   ? PAD/SET Patient? No   ?  ? Pain Assessment  ? Currently in Pain? No/denies   ? Multiple Pain Sites No   ? ?  ?  ? ?  ? ? ?Capillary Blood Glucose: ?No results found for this or any previous visit (from the past 24 hour(s)). ? ? ? ?Social History  ? ?Tobacco Use  ?Smoking Status Never  ?Smokeless Tobacco Never  ? ? ?Goals Met:  ?Proper associated with RPD/PD & O2 Sat ?Independence with exercise equipment ?Exercise tolerated well ?No report of concerns or symptoms today ?Strength training completed today ? ?Goals Unmet:  ?Not Applicable ? ?Comments: Service time is from 1021 to 1138.  ? ? ?Dr. Rodman Pickle is Medical Director for Pulmonary Rehab at [facility].  ?

## 2021-06-04 ENCOUNTER — Encounter (HOSPITAL_COMMUNITY): Payer: BC Managed Care – PPO

## 2021-06-04 ENCOUNTER — Telehealth (HOSPITAL_COMMUNITY): Payer: Self-pay | Admitting: *Deleted

## 2021-06-04 NOTE — Telephone Encounter (Signed)
Patient left message on department voicemail stating she will be absent from pulmonary rehab, not feeling strong enough today. Plans to return Thursday. ?

## 2021-06-06 ENCOUNTER — Telehealth (HOSPITAL_COMMUNITY): Payer: Self-pay

## 2021-06-06 ENCOUNTER — Encounter (HOSPITAL_COMMUNITY): Payer: BC Managed Care – PPO

## 2021-06-06 NOTE — Telephone Encounter (Signed)
Tried to return call from [place]. There was no answer and the voicemail was full, so I was unable to leave a message.  ?

## 2021-06-11 ENCOUNTER — Telehealth (HOSPITAL_COMMUNITY): Payer: Self-pay

## 2021-06-11 ENCOUNTER — Encounter (HOSPITAL_COMMUNITY): Payer: BC Managed Care – PPO

## 2021-06-11 NOTE — Telephone Encounter (Signed)
Returned call from pt. Mary Orr and discussed discharge this Thursday or next Tuesday. Mary Orr agreed with those dates.  ?

## 2021-06-13 ENCOUNTER — Encounter (HOSPITAL_COMMUNITY): Payer: BC Managed Care – PPO

## 2021-06-18 ENCOUNTER — Telehealth (HOSPITAL_COMMUNITY): Payer: Self-pay

## 2021-06-18 NOTE — Telephone Encounter (Signed)
Called pt's spouse because pt did not pick up and her mailbox was full. Pt's spouse stated that he will have her call me back. Gave department number to spouse.

## 2021-06-19 NOTE — Progress Notes (Signed)
Discharge Progress Report  Patient Details  Name: Mary Orr MRN: [id number] Date of Birth: 01-Feb-1955 Referring Provider:   [facility] Walk Test from 04/08/2021 in [place]  Referring Provider [place]        Number of Visits: 11  Reason for Discharge:  Early Exit:  [name]'s attendance was not consistent due to her post Covid  crashes. She decided to return for her walk test but, did not show up for either of the 2 times that we had her scheduled.We called her husband and ask him to have her call us but, we did not receive a call from her. We dropped her from the program.  Smoking History:  Social History   Tobacco Use  Smoking Status Never  Smokeless Tobacco Never    Diagnosis:  Dyspnea on exertion  Post-COVID-19 syndrome  ADL UCSD:  Pulmonary Assessment Scores     Row Name 04/08/21 1134         ADL UCSD   ADL Phase Entry     SOB Score total 10       CAT Score   CAT Score 10       mMRC Score   mMRC Score 2              Initial Exercise Prescription:  Initial Exercise Prescription - 04/08/21 1200       Date of Initial Exercise RX and Referring Provider   Date 04/08/21    Referring Provider Ramaswamy    Expected Discharge Date 06/13/21      NuStep   Level 1    SPM 80    Minutes 15      Track   Minutes 15    METs 4.13      Prescription Details   Frequency (times per week) 2    Duration Progress to 30 minutes of continuous aerobic without signs/symptoms of physical distress      Intensity   THRR 40-80% of Max Heartrate 62-125    Ratings of Perceived Exertion 11-13    Perceived Dyspnea 0-4      Progression   Progression Continue to progress workloads to maintain intensity without signs/symptoms of physical distress.      Resistance Training   Training Prescription Yes    Weight blue bands    Reps 10-15             Discharge Exercise Prescription (Final Exercise  Prescription Changes):  Exercise Prescription Changes - 06/11/21 1100       Response to Exercise   Blood Pressure (Admit) 114/76    Blood Pressure (Exit) 126/76    Heart Rate (Admit) 91 bpm    Heart Rate (Exercise) 128 bpm    Heart Rate (Exit) 101 bpm    Oxygen Saturation (Admit) 97 %    Oxygen Saturation (Exercise) 94 %    Oxygen Saturation (Exit) 95 %    Rating of Perceived Exertion (Exercise) 12    Perceived Dyspnea (Exercise) 1    Duration Continue with 30 min of aerobic exercise without signs/symptoms of physical distress.    Intensity THRR unchanged      Progression   Progression Continue to progress workloads to maintain intensity without signs/symptoms of physical distress.      Resistance Training   Training Prescription Yes    Weight Blue bands    Reps 10-15    Time 10 Minutes      Treadmill   MPH 3.1  Grade 1.5    Minutes 15    METs 4.01      NuStep   Level 4    SPM 90    Minutes 15    METs 3.4             Functional Capacity:  6 Minute Walk     Row Name 04/08/21 1239         6 Minute Walk   Phase Initial     Distance 1760 feet     Walk Time 6 minutes     # of Rest Breaks 0     MPH 3.33     METS 4.13     RPE 11     Perceived Dyspnea  1     VO2 Peak 14.44     Symptoms No     Resting HR 80 bpm     Resting BP 118/64     Resting Oxygen Saturation  96 %     Exercise Oxygen Saturation  during 6 min walk 93 %     Max Ex. HR 125 bpm     Max Ex. BP 134/68     2 Minute Post BP 116/64       Interval HR   1 Minute HR 84     2 Minute HR 121     3 Minute HR 122     4 Minute HR 121     5 Minute HR 121     6 Minute HR 125     2 Minute Post HR 90     Interval Heart Rate? Yes       Interval Oxygen   Interval Oxygen? Yes     Baseline Oxygen Saturation % 96 %     1 Minute Oxygen Saturation % 93 %     1 Minute Liters of Oxygen 0 L     2 Minute Oxygen Saturation % 94 %     2 Minute Liters of Oxygen 0 L     3 Minute Oxygen Saturation % 96  %     3 Minute Liters of Oxygen 0 L     4 Minute Oxygen Saturation % 94 %     4 Minute Liters of Oxygen 0 L     5 Minute Oxygen Saturation % 95 %     5 Minute Liters of Oxygen 0 L     6 Minute Oxygen Saturation % 95 %     6 Minute Liters of Oxygen 0 L     2 Minute Post Oxygen Saturation % 97 %     2 Minute Post Liters of Oxygen 0 L              Psychological, QOL, Others - Outcomes: PHQ 2/9:    04/08/2021   11:09 AM  Depression screen PHQ 2/9  Decreased Interest 0  Down, Depressed, Hopeless 0  PHQ - 2 Score 0  Altered sleeping 0  Tired, decreased energy 0  Change in appetite 0  Feeling bad or failure about yourself  0  Trouble concentrating 0  Moving slowly or fidgety/restless 0  Suicidal thoughts 0  PHQ-9 Score 0  Difficult doing work/chores Not difficult at all    Quality of Life:   Personal Goals: Goals established at orientation with interventions provided to work toward goal.  Personal Goals and Risk Factors at Admission - 04/08/21 1112       Core Components/Risk Factors/Patient Goals on Admission  Weight Management Yes    Goal Weight: Long Term 136 lb (61.7 kg)    Improve shortness of breath with ADL's Yes    Intervention Provide education, individualized exercise plan and daily activity instruction to help decrease symptoms of SOB with activities of daily living.    Expected Outcomes Short Term: Improve cardiorespiratory fitness to achieve a reduction of symptoms when performing ADLs;Long Term: Be able to perform more ADLs without symptoms or delay the onset of symptoms              Personal Goals Discharge:  Goals and Risk Factor Review     Row Name 04/18/21 0908 05/15/21 1730           Core Components/Risk Factors/Patient Goals Review   Personal Goals Review Weight Management/Obesity;Develop more efficient breathing techniques such as purse lipped breathing and diaphragmatic breathing and practicing self-pacing with activity.;Increase  knowledge of respiratory medications and ability to use respiratory devices properly.;Improve shortness of breath with ADL's Weight Management/Obesity;Improve shortness of breath with ADL's;Develop more efficient breathing techniques such as purse lipped breathing and diaphragmatic breathing and practicing self-pacing with activity.;Increase knowledge of respiratory medications and ability to use respiratory devices properly.      Review Tishauna just started pulmonary rehab, has attended 1 exercise session, it is too early to see progression towards goals.  She is very high functioning and walked 19 laps on the track in 15 minutes.  I forsee that she will progress quickly in the program. Lariza is progressing well in her workloads and METs in the PR program. She continues be at a high level functioning. She has now moved from the track to the treadmill and she is tolerating activity without difficulty. She reports only mld SOB with exercise.      Expected Outcomes See admission goals. See admission goals. For her to continue to progress in her workloads and METs.               Exercise Goals and Review:  Exercise Goals     Row Name 04/08/21 1115 04/18/21 0836 05/15/21 1051         Exercise Goals   Increase Physical Activity Yes Yes Yes     Intervention Provide advice, education, support and counseling about physical activity/exercise needs.;Develop an individualized exercise prescription for aerobic and resistive training based on initial evaluation findings, risk stratification, comorbidities and participant's personal goals. Provide advice, education, support and counseling about physical activity/exercise needs.;Develop an individualized exercise prescription for aerobic and resistive training based on initial evaluation findings, risk stratification, comorbidities and participant's personal goals. Provide advice, education, support and counseling about physical activity/exercise  needs.;Develop an individualized exercise prescription for aerobic and resistive training based on initial evaluation findings, risk stratification, comorbidities and participant's personal goals.     Expected Outcomes Short Term: Attend rehab on a regular basis to increase amount of physical activity.;Long Term: Add in home exercise to make exercise part of routine and to increase amount of physical activity.;Long Term: Exercising regularly at least 3-5 days a week. Short Term: Attend rehab on a regular basis to increase amount of physical activity.;Long Term: Add in home exercise to make exercise part of routine and to increase amount of physical activity.;Long Term: Exercising regularly at least 3-5 days a week. Short Term: Attend rehab on a regular basis to increase amount of physical activity.;Long Term: Add in home exercise to make exercise part of routine and to increase amount of physical activity.;Long Term: Exercising regularly at  least 3-5 days a week.     Increase Strength and Stamina Yes Yes Yes     Intervention Provide advice, education, support and counseling about physical activity/exercise needs.;Develop an individualized exercise prescription for aerobic and resistive training based on initial evaluation findings, risk stratification, comorbidities and participant's personal goals. Provide advice, education, support and counseling about physical activity/exercise needs.;Develop an individualized exercise prescription for aerobic and resistive training based on initial evaluation findings, risk stratification, comorbidities and participant's personal goals. Provide advice, education, support and counseling about physical activity/exercise needs.;Develop an individualized exercise prescription for aerobic and resistive training based on initial evaluation findings, risk stratification, comorbidities and participant's personal goals.     Expected Outcomes Short Term: Increase workloads from  initial exercise prescription for resistance, speed, and METs.;Short Term: Perform resistance training exercises routinely during rehab and add in resistance training at home;Long Term: Improve cardiorespiratory fitness, muscular endurance and strength as measured by increased METs and functional capacity (6MWT) Short Term: Increase workloads from initial exercise prescription for resistance, speed, and METs.;Short Term: Perform resistance training exercises routinely during rehab and add in resistance training at home;Long Term: Improve cardiorespiratory fitness, muscular endurance and strength as measured by increased METs and functional capacity (6MWT) Short Term: Increase workloads from initial exercise prescription for resistance, speed, and METs.;Short Term: Perform resistance training exercises routinely during rehab and add in resistance training at home;Long Term: Improve cardiorespiratory fitness, muscular endurance and strength as measured by increased METs and functional capacity (6MWT)     Able to understand and use rate of perceived exertion (RPE) scale Yes Yes Yes     Intervention Provide education and explanation on how to use RPE scale Provide education and explanation on how to use RPE scale Provide education and explanation on how to use RPE scale     Expected Outcomes Short Term: Able to use RPE daily in rehab to express subjective intensity level;Long Term:  Able to use RPE to guide intensity level when exercising independently Short Term: Able to use RPE daily in rehab to express subjective intensity level;Long Term:  Able to use RPE to guide intensity level when exercising independently Short Term: Able to use RPE daily in rehab to express subjective intensity level;Long Term:  Able to use RPE to guide intensity level when exercising independently     Able to understand and use Dyspnea scale Yes Yes Yes     Intervention Provide education and explanation on how to use Dyspnea scale Provide  education and explanation on how to use Dyspnea scale Provide education and explanation on how to use Dyspnea scale     Expected Outcomes Short Term: Able to use Dyspnea scale daily in rehab to express subjective sense of shortness of breath during exertion;Long Term: Able to use Dyspnea scale to guide intensity level when exercising independently Short Term: Able to use Dyspnea scale daily in rehab to express subjective sense of shortness of breath during exertion;Long Term: Able to use Dyspnea scale to guide intensity level when exercising independently Short Term: Able to use Dyspnea scale daily in rehab to express subjective sense of shortness of breath during exertion;Long Term: Able to use Dyspnea scale to guide intensity level when exercising independently     Knowledge and understanding of Target Heart Rate Range (THRR) Yes Yes Yes     Intervention Provide education and explanation of THRR including how the numbers were predicted and where they are located for reference Provide education and explanation of THRR including how the  numbers were predicted and where they are located for reference Provide education and explanation of THRR including how the numbers were predicted and where they are located for reference     Expected Outcomes Short Term: Able to state/look up THRR;Long Term: Able to use THRR to govern intensity when exercising independently;Short Term: Able to use daily as guideline for intensity in rehab Short Term: Able to state/look up THRR;Long Term: Able to use THRR to govern intensity when exercising independently;Short Term: Able to use daily as guideline for intensity in rehab Short Term: Able to state/look up THRR;Long Term: Able to use THRR to govern intensity when exercising independently;Short Term: Able to use daily as guideline for intensity in rehab     Understanding of Exercise Prescription Yes Yes Yes     Intervention Provide education, explanation, and written materials on  patient's individual exercise prescription Provide education, explanation, and written materials on patient's individual exercise prescription Provide education, explanation, and written materials on patient's individual exercise prescription     Expected Outcomes Short Term: Able to explain program exercise prescription;Long Term: Able to explain home exercise prescription to exercise independently Short Term: Able to explain program exercise prescription;Long Term: Able to explain home exercise prescription to exercise independently Short Term: Able to explain program exercise prescription;Long Term: Able to explain home exercise prescription to exercise independently              Exercise Goals Re-Evaluation:  Exercise Goals Re-Evaluation     Row Name 04/18/21 0836 05/15/21 1052           Exercise Goal Re-Evaluation   Exercise Goals Review Increase Physical Activity;Increase Strength and Stamina;Able to understand and use rate of perceived exertion (RPE) scale;Able to understand and use Dyspnea scale;Knowledge and understanding of Target Heart Rate Range (THRR);Understanding of Exercise Prescription Increase Physical Activity;Increase Strength and Stamina;Able to understand and use rate of perceived exertion (RPE) scale;Able to understand and use Dyspnea scale;Knowledge and understanding of Target Heart Rate Range (THRR);Understanding of Exercise Prescription      Comments Rosela has completed 1 exercise session. She exercises for 15 min on the Nustep and track. Jeriann averaged 2.2 METs at level 1 on the Nustep and 3.2 METs on the track. Will consider transitioning her to the treadmill. It is too soon note any discernable progressions. Will continue to monitor and progress as able. Ayak has completed 6 exercise sessions. She exercises for 15 min on the Nustep and treadmill. Tyyonna averaged 2.4 METs at level 3 on the Nustep and 3.44 METs on the treadmill. She has been progressed from the  track to the treadmill as she tolerates this well. Her level on the Nustep has also increased. She tolerates this progression well. Ashlyn has missed a few classes to due to family events. This has made if difficult for me to progress her on the treamil. Ellinor performs the warmup and cooldown standing without limitations. She is very motivated to exercise and improve her functional capacity Will continue to monitor and progress as able.      Expected Outcomes Through exercise at rehab and home, the patient will decrease shortness of breath with daily activities and feel confident in carrying out an exercise regimen at home. Through exercise at rehab and home, the patient will decrease shortness of breath with daily activities and feel confident in carrying out an exercise regimen at home.               Nutrition & Weight - Outcomes:  Post Biometrics - 04/08/21 1059        Post  Biometrics   Grip Strength 22 kg             Nutrition:  Nutrition Therapy & Goals - 05/29/21 1221       Nutrition Therapy   Diet Normal, healthful diet      Personal Nutrition Goals   Nutrition Goal Patient to identify food quantities and strategies for weight loss of 0.5-2.0lb per week.   Reviewed multiple strategies for weight loss including the plate method, increased protein intake, tracking intake, etc.   Personal Goal #2 Patient to increase non-starchy vegetable intake at both lunch and dinner to align with the plate method as a guide for meal planning.    Personal Goal #3 Patient to begin tracking intake.   Reviewed free tracking apps such as MyFitness Pal to support weight loss efforts     Intervention Plan   Intervention Prescribe, educate and counsel regarding individualized specific dietary modifications aiming towards targeted core components such as weight, hypertension, lipid management, diabetes, heart failure and other comorbidities.;Nutrition handout(s) given to patient.    Expected  Outcomes Short Term Goal: Understand basic principles of dietary content, such as calories, fat, sodium, cholesterol and nutrients.;Short Term Goal: A plan has been developed with personal nutrition goals set during dietitian appointment.;Long Term Goal: Adherence to prescribed nutrition plan.             Nutrition Discharge:   Education Questionnaire Score:  Knowledge Questionnaire Score - 04/08/21 1131       Knowledge Questionnaire Score   Pre Score 17/18             Goals reviewed with patient; copy given to patient.

## 2021-11-28 ENCOUNTER — Ambulatory Visit: Payer: BC Managed Care – PPO | Admitting: Internal Medicine

## 2021-11-28 ENCOUNTER — Encounter: Payer: Self-pay | Admitting: Internal Medicine

## 2021-11-28 VITALS — BP 116/64 | HR 102 | Temp 98.1°F | Ht 63.0 in | Wt 172.8 lb

## 2021-11-28 DIAGNOSIS — Z8616 Personal history of COVID-19: Secondary | ICD-10-CM

## 2021-11-28 DIAGNOSIS — G9332 Myalgic encephalomyelitis/chronic fatigue syndrome: Secondary | ICD-10-CM

## 2021-11-28 DIAGNOSIS — U099 Post covid-19 condition, unspecified: Secondary | ICD-10-CM | POA: Diagnosis not present

## 2021-11-28 MED ORDER — NIRMATRELVIR/RITONAVIR (PAXLOVID)TABLET: 3.0000 | ORAL_TABLET | Freq: Two times a day (BID) | ORAL | 0 refills | Status: AC

## 2021-11-28 NOTE — Progress Notes (Signed)
OV 11/29/2019  Subjective:  Patient ID: Mary Orr, female , DOB: [birth year] , age 66 y.o. , MRN: [id number] , ADDRESS: [address] [place] [address] PCP Eartha Inch, MD Patient Care Team: Eartha Inch, MD as PCP - General (Family Medicine)  This Provider for this visit: Treatment Team:  Attending Provider: Kalman Shan, MD  11/29/2019 -   Chief Complaint  Patient presents with   Consult    Post-covid, long-hauler cough, Covid las   HPI Mary Orr 66 y.o. - with a PMHx of allergies following by Dr. Irena Cords, COVID-19 pneumonia in October 2020 with post-COVID-19 syndrome, hypothyroidism, GERD who is here today for evaluation of chronic cough. Mary Orr states that she first contracted COVID-19 in October 23020 and was hospitalized for a short period of time. After discharge, she continued to experience significant fatigue with excessive sleepiness that lasted up till 1.5 months ago. During this time, she was sleeping approximately 10-12 hours per day. She also experienced both dry and productive cough that is worst in the morning but occurs at night, at rest and with exertion throughout the day. The cough has improved somewhat but has still persisted. It is not necessarily improved with rest or drinking sips of water. Mary Orr states that her family has mentioned she is coughing in the night-time but she does not always wake for it. In the past year, Mary Orr has also experienced persistent SOB that is worsened with exertion. For these symptoms, she has followed up with Dr. Jerre Simon in [place]. At the time, she underwent PFTs and lab work (please see below), that was largely negative. Mary Orr states she was told her symptoms may be related to underlying sleep disorder and recommendation was for sleep studies, however she has decided not to complete these after following up with her PCP. Overall, her fatigue and GI symptoms from COVID-19 improved  the most, however her cough and SOB have persisted.   In the past 2 weeks, Mary Orr states she has also experienced a viral URI that affected her entire family. She has worsening in her cough and SOB during this time. Her PCP prescribed her Amoxicillin and Prednisone; she completed the Amoxicillin course 4 days, however did not begin the Prednisone pack. She has begun to feel better at this time.   She denies any previous history of lung disease other than asthma. No family history of ILD or rheumatological disorders.   PFTs (04/27/2019): Read by Dr. Jerre Simon as normal on [facility]. Unable to obtain full results.   CTA (10/29/2018) IMPRESSION:  1.  No pulmonary embolism.  2.  No aortic aneurysm or dissection.  3.  Peripheral areas of interstitial thickening/scarring bilaterally. Multifocal groundglass opacity likely represent superimposed atypical viral pneumonia..   Echo (12/28/2018)  Normal left ventricular structure and size. Wall thickness is normal.  Systolic function is normal with an ejection fraction of 55-65%. Wall  motion is within normal limits. Unable to assess diastolic function. There  is no thrombus.  Complement C3, Serum 82 - 167 mg/dL 644        C4 Complement 12 - 38 mg/dL 19        ANA Direct Negative  Negative        RA Quant 0.0 - 13.9 IU/mL <10.0        Anti-DNA (DS) Ab Qn 0 - 9 IU/mL <1  Negative      <5                                     Equivocal  5 - 9                                     Positive      >9      Sjogren's Antibodies(SSA) 0.0 - 0.9 AI <0.2        Sjogren's Antibodies(SSB) 0.0 - 0.9 AI <0.2        RNP Antibodies 0.0 - 0.9 AI 0.4        Smith Antibodies 0.0 - 0.9 AI <0.2     Antimyeloperoxidase (MPO) Abs 0.0 - 9.0 U/mL <9.0        Antiproteinase 3 (PR-3) Abs 0.0 - 3.5 U/mL <3.5        C-ANCA Neg:<1:20 titer <1:20        P-ANCA Neg:<1:20 titer <1:20     Total IgG 586 - 1,602 mg/dL 161674      IgA  87 - 096352 mg/dL 045115      IgM 26 - 409217 mg/dL 811225 High       Immunoglobulin E, Total 6 - 495 IU/mL 4 Low      Dr Gretta CoolKouffman Reflux Symptom Index (> 13-15 suggestive of LPR cough) 0 -> 5  =  none ->severe problem  Hoarseness of problem with voice 1  Clearing  Of Throat 1  Excess throat mucus or feeling of post nasal drip 3  Difficulty swallowing food, liquid or tablets 3  Cough after eating or lying down 2  Breathing difficulties or choking episodes 1  Troublesome or annoying cough 4  Sensation of something sticking in throat or lump in throat 1  Heartburn, chest pain, indigestion, or stomach acid coming up 3    xxxx    12/23/2019  - Visit   66 year old female never smoker followed in our office for chronic cough and post Covid syndrome.  She is established with Dr. Marchelle Gearingamaswamy.  She was last seen on 11/29/2019.  It was recommended at that office visit that she obtain a high-resolution CT chest, obtain pulmonary function testing and continue to follow-up with ENT in [place], continue follow-up with Dr. Madie RenoVanwinkle for allergy asthma.  Patient presenting today after completing pulmonary function testing those results are listed below:  12/23/2019-pulmonary function test-FVC 2.65 (84% predicted), postbronchodilator ratio 88, postbronchodilator FEV1 2.48 (103% predicted), positive bronchodilator response in the FEV1, TLC 5.27 (107% predicted), DLCO 23.31 (120% predicted)  Per consult note on 11/29/2019 patient was diagnosed with COVID-19 pneumonia in October/2020.  She was hospitalized.  Patient reporting today she never required a mechanical ventilator.  She did receive steroids.  She also received high flow nasal cannula as initial oxygenation when hospitalized was in the low 80s.  Patient is a Holiday representativeprofessional singer.  She is also noticed that over the last year she has had ongoing difficulty with singing.  Prior to COVID-19 infection patient was very active walking between 5 to 10 miles a day.   She reports that she is unable to even walk closer to a mile at this point in time.  She still deals with ongoing fatigue.  She did not notice or remember a significant improvement in her symptoms when she was  taking Qvar.  She takes Qvar seasonally as managed by Dr. Fredderick Phenix her allergist.  She also takes Xyzal.  She has considerable amount of postnasal drip.  She reports a cough most evenings when laying flat.  Questionaires / Pulmonary Flowsheets:   ACT:  No flowsheet data found.  MMRC: No flowsheet data found.  Epworth:  No flowsheet data found.  Tests:   12/05/2019-CT chest high-res-faint subpleural groundglass in the left lower lobe is nonspecific but may reflect subtle post COVID-19 inflammatory fibrosis, otherwise no evidence of interstitial lung disease, mild air trapping is indicative of small airways disease, marginal irregularity in the liver is indicative of cirrhosis, aortic arthrosclerosis  12/23/2019-pulmonary function test-FVC 2.65 (84% predicted), postbronchodilator ratio 88, postbronchodilator FEV1 2.48 (103% predicted), positive bronchodilator response in the FEV1, TLC 5.27 (107% predicted), DLCO 23.31 (120% predicted)    OV 02/29/2020  Subjective:  Patient ID: Mary Orr, female , DOB: 02/27/1955 , age 80 y.o. , MRN: [id number] , ADDRESS: [address] [place] [address] PCP Chesley Noon, MD Patient Care Team: Chesley Noon, MD as PCP - General (Family Medicine)  This Provider for this visit: Treatment Team:  Attending Provider: Brand Males, MD    02/29/2020 -   Chief Complaint  Patient presents with   Follow-up    Doing better    Follow-up post COVID pneumonia October 2020 with admission  HPI Mary Orr 66 y.o. -last seen in November 2021 after that she followed up with nurse practitioner.  At that time subtle early ILD changes were noted on the CT scan of the chest.  She tells me that she has continued to improve in  terms of her cough and shortness of breath.  She has an occasional light blue color changes to her lips.  At the time she checks oxygen saturation with the finger and this is normal.  She denies similar discoloration of her hands or fingers.  Denies that her hands turn cold.  Cough and shortness of breath improving.  Symptom score is listed below.  She is concerned about the ILD changes in the CT scan of the chest and inquired about it.  Overall she feels her course is one of significant improvement.  She is relieved that she is improving.       OV 11/08/2020  Subjective:  Patient ID: Mary Orr, female , DOB: 21-Feb-1955 , age 48 y.o. , MRN: [id number] , ADDRESS: [address] [place] [address] PCP Chesley Noon, MD Patient Care Team: Chesley Noon, MD as PCP - General (Family Medicine)  This Provider for this visit: Treatment Team:  Attending Provider: Brand Males, MD    11/08/2020 -   Chief Complaint  Patient presents with   Follow-up    Pt states she has been doing better since last visit. States her cough is also better.   Follow-up post-COVID pneumonia admission October 2020   HPI Mary Orr 66 y.o. -is now 2 years since she got admitted.  She has been following with [place] come 1 years this November 2022.  She tells me that overall 6 dyspnea on exertion might be some better but she is not sure.  The main issue she tells me is that 2 months prior to her COVID in October 2020 she was diagnosed with allergic asthma and given Qvar.  Then she got COVID with hospitalization in October 2020.  This required oxygen therapy.  Then she spent much of 2021  with a cough.  When she saw Elisha Headland the nurse practitioner [phone] he gave her Symbicort.  This seemed to help but in the year 2022 she stopped in the spring 2022 after she saw me in February 2022.  That she got a viral infection that required prednisone and antibiotics.  Then she went back on Symbicort and then  stopped it again and then by summer 2022 had another bad respiratory infection that required antibiotic and prednisone again.  She says these episodes were severe.  Since July 2022 she has not been on any Symbicort.  No inhaled steroids.  She is asking if she should be taking Symbicort again.  She says 2 weeks ago she saw Dr. Madie Reno and has been recommended to stay on Symbicort.  She says she is only realizing now that is a maintenance inhaler.  She does seem to think that during Symbicort she was not getting respiratory infections.  She somewhat feels that the absence of Symbicort is making her prone to respiratory infections.  She still has fixed dyspnea on exertion.  She thinks is some improved compared to a year ago but nowhere near pre-COVID.  In fact she tells me that pre-COVID she used to walk 5 miles but now she is barely able to make a mile.   She is not sure if the Symbicort earlier in the year was actually helping her fixed dyspnea.  Current symptom score is listed below and clearly shows that she only might be marginally better compared to a year ago  In addition she is reporting episodes of exhaustion.  These happen randomly.  During this time all her symptoms get accentuated.  This characterized by worsening of the same symptoms as below..    Scymbicort $300 - too expensive. Wants alternative  PFT  OV 01/04/2021  Subjective:  Patient ID: Mary Orr, female , DOB: 05/31/1955 , age 31 y.o. , MRN: [id number] , ADDRESS: [address] [place] [address] PCP Eartha Inch, MD Patient Care Team: Eartha Inch, MD as PCP - General (Family Medicine)  This Provider for this visit: Treatment Team:  Attending Provider: Coral Ceo, NP    01/04/2021 -   Chief Complaint  Patient presents with   Follow-up    Went to urgent care 3 weeks ago.    Follow-up post COVID long-haul symptoms  HPI Mary Orr 66 y.o. -she has had extensive work-up.  CT scan of the  chest does show some postinflammatory scarring in the bases.  Very minimal.  No ILD.  Had cardiopulmonary exercise stress test VO2 Max adequate normal for sedentary person.  This correction on the VO2 based on ideal body weight.  This suggest obesity as the reason for dyspnea.  Pulmonary function test is normal.  Overall she is feels some better but then she again continues to report alternating feelings of being good and then having episodes where she feels like her whole body is crashing and she is significantly symptomatic.  This happens 5 or 7 times a month.  However symptoms seem the same according to history but her nadir symptoms seem better.  She states 1 week prior to this visit she had a day when she thought she was coming down with the flu after getting over a cold or bronchitis.  She feels her body was flushed and coughing she went to bed several times and missed work but then the next morning she was completely fine.  This is  how she describes the crash.    So far objective testing only shows obesity related dyspnea.  She was on Breo but she not on it currently.  Her current body weight is 170 pounds.  For a BMI of 25 her weight should be 145 pounds.  Dec 2022  01/03/21 15:40  FeNO level (ppb) 27     IMPRESSION: ct chest high res 1. Minimal, bland appearing bandlike scarring of the bilateral lung bases. No evidence of fibrotic interstitial lung disease. 2. This coarse somewhat nodular contour of the liver in the included upper abdomen, suggestive of cirrhosis. 3. Aortic atherosclerosis.   Aortic Atherosclerosis (ICD10-I70.0).     Electronically Signed   By: Jearld Lesch M.D.   On: 12/11/2020 14:59    Cpex 11/15/2    Interpretation   Notes: Patient gave an excellent effort. Pulse-oximetry remained 96-98% for the duration of exercise. Exercise was performed on a cycle ergometer starting at [facility] and increasing by 10W/min.   ECG:  Resting ECG in normal sinus rhythm. HR  response appropriate. There were no sustained arrhythmias or ST-T changes. BP response appropriate.   PFT:  Pre-exercise spirometry was within normal limits. The MVV was normal. Post-exercise spirometry within normal limits.   CPX:  Exercise Capacity- Exercise testing with gas exchange demonstrates a normal peak VO2 of 15.2 ml/kg/min (85% of the age/gender/weight matched sedentary norms). The RER of 1.11 indicates a maximal effort. When adjusted to the patient's ideal body weight of 138.6 lb (62.9 kg) the peak VO2 is 18.3 ml/kg (ibw)/min (102% of the ibw-adjusted predicted).   Cardiovascular response- The O2pulse (a surrogate for stroke volume) increased with incremental exercise reaching peak at 9 ml/beat (100% predicted). DeltaVO2/Delta WR is normal at 10 Indicating No evidence of cardiovascular impairment.   Ventilatory response- The VE/VCO2 slope normal. The oxygen uptake efficiency slope (OUES) is normal. The VO2 at the ventilatory threshold was normal at 62% of the predicted peak VO2. At peak exercise, the ventilation reached 33% of the measured MVV and breathing reserve was 78 indicating ventilatory reserve remained.  PETCO2 was  normal at 36 mmHg during peak exercise.      Conclusion: Exercise testing with gas exchange demonstrates normal functional capacity when compared to matched sedentary norms. There is no indication for cardiopulmonary abnormality or exercise-induced bronchospasm. Corrections for ideal body weight suggest body habitus is contributing to exercise intolerance.    Test, report and preliminary impression by:  Reggy Eye, MS, ACSM-RCEP  11/27/2020 3:50 PM    FINALIZED.  Chilton Greathouse MD  [facility]  12/04/2020, 5:18 PM      OV 11/28/2021  Subjective:  Patient ID: Mary Orr, female , DOB: [birth year] , age 23 y.o. , MRN: [id number] , ADDRESS: [address] [place] [address] PCP Eartha Inch, MD Patient Care  Team: Eartha Inch, MD as PCP - General (Family Medicine)  This Provider for this visit: Treatment Team:  Attending Provider: Kalman Shan, MD    11/28/2021 -   Chief Complaint  Patient presents with   Follow-up    Pt states she is about the same since last visit.     HPI Mary Orr 66 y.o. -returns for follow-up of post COVID long-haul.  She tells me January 2023 she got COVID again and was treated with Paxlovid after that she started feeling really well for 3 months.  She felt her long-haul symptoms all resolved.  She believes the viral  clearance resulted in resolution of the long-haul symptoms.  Then in March 2023 she started attending pulmonary rehabilitation.  She says she pushed herself really hard and then she developed one of her classic crash with physical exertion that she slept for 30 hours.  She missed rehab.  Slowly recovered and then went back to rehab.  Then did rehab at a lower intensity then by Easter 2023 started ramping up on rehab but then had another "crash" and then was fatigued and sleeping a lot.  After that again slowly improved effort tolerance at rehab and then had another crash again.  Since then she is not into rehab.  Is been few to several months since she attended rehab.  Nevertheless she feels her "crash cycles" are getting better.  The intensity of fatigue is less the duration of fatigue is less.  She is quite interested in repeat of empiric Paxlovid treatment because of the theory of circulating very on particles and a previous personal experience.  We discussed some literature with metformin which is shown to be beneficial in reducing long-haul development but is again given in the immediate COVID acute illness setting.  She is not interested in this.  We took a shared decision that there is no literature to support taking Paxlovid at this point but given the low risk profile [last creatinine check 2020] and previous tolerance with it it would be  okay.  I did a quick med review and there are no contraindicated meds    SYMPTOM SCALE - Nov 2021 02/29/2020  11/08/2020 abaseline 11/08/2020 During exhaustion episodes 01/03/21 regular day 01/03/21 during crash days 11/28/2021   O2 use  ra       Shortness of Breath  0 -> 5 scale with 5 being worst (score 6 If unable to do)       At rest 0 0 1 2.5 0 4   Simple tasks - showers, clothes change, eating, shaving 0 0 Household (dishes, doing bed, laundry) 2 1.5 1.5 3 1 5    Shopping Walking level at own pace 2 2 1.5 4 1 4    Walking up Stairs 3 3.5 1 3 2 5    Total (30-36) Dyspnea Score 17.5 6 26    How bad is your cough? 2 0.5 0. Bad during episodes 1    How bad is your fatigue 1 0.5 1 4 1 4    How bad is nausea  0 0 0 0 1   How bad is vomiting?   00 0 0 0 0   How bad is diarrhea?  0 0 0 0 0   How bad is anxiety?  0 0 0 0 3   How bad is depression  0 0 00 0 3   0  PFT     Latest Ref Rng & Units 12/23/2019    9:03 AM  PFT Results  FVC-Pre L 2.65   FVC-Predicted Pre % 84   FVC-Post L 2.83   FVC-Predicted Post % 90   Pre FEV1/FVC % % 83   Post FEV1/FCV % % 88   FEV1-Pre L 2.20   FEV1-Predicted Pre % 91   FEV1-Post L 2.48   DLCO uncorrected ml/min/mmHg 23.31   DLCO UNC% % 120   DLCO corrected ml/min/mmHg 23.31   DLCO COR %Predicted % 120   DLVA Predicted % 120   TLC L 5.27   TLC %  Predicted % 107   RV % Predicted % 123        has a past medical history of Thyroid disease.   reports that she has never smoked. She has never used smokeless tobacco.  Past Surgical History:  Procedure Laterality Date   BACK SURGERY     NASAL SINUS SURGERY      No Known Allergies  Immunization History  Administered Date(s) Administered   Influenza,inj,Quad PF,6+ Mos 10/28/2016, 10/29/2017, 12/23/2019   Influenza,trivalent, recombinat, inj, PF 12/01/2011   PFIZER(Purple Top)SARS-COV-2 Vaccination 05/03/2019, 05/24/2019, 10/17/2019   Tdap 05/31/2015    No  family history on file.   Current Outpatient Medications:    albuterol (PROVENTIL) (2.5 MG/3ML) 0.083% nebulizer solution, Take by nebulization., Disp: , Rfl:    albuterol (VENTOLIN HFA) 108 (90 Base) MCG/ACT inhaler, Inhale 2 puffs into the lungs every 6 (six) hours as needed., Disp: , Rfl:    aspirin 81 MG EC tablet, Take by mouth., Disp: , Rfl:    azelastine (ASTELIN) 0.1 % nasal spray, 1 spray., Disp: , Rfl:    buPROPion (WELLBUTRIN XL) 150 MG 24 hr tablet, TAKE 1 TABLET(150 MG) BY MOUTH DAILY, Disp: , Rfl:    chlorpheniramine (ALLER-CHLOR) 4 MG tablet, TAKE 1-2 TABLETS AT NIGHT - FOR MANAGEMENT OF ALLERGIES AND POSTNASAL DRIP AT NIGHT, Disp: 60 tablet, Rfl: 3   Diclofenac Sodium CR 100 MG 24 hr tablet, Take by mouth., Disp: , Rfl:    EPINEPHrine 0.3 mg/0.3 mL IJ SOAJ injection, Inject 0.3 mLs (0.3 mg total) into the muscle as needed for anaphylaxis., Disp: 1 each, Rfl: 0   fluticasone furoate-vilanterol (BREO ELLIPTA) 100-25 MCG/INH AEPB, Inhale 1 puff into the lungs daily., Disp: 14 each, Rfl: 0   levocetirizine (XYZAL) 5 MG tablet, Take by mouth., Disp: , Rfl:    levothyroxine (SYNTHROID) 50 MCG tablet, Take by mouth., Disp: , Rfl:    Multiple Vitamin (MULTIVITAMIN) capsule, Take 1 capsule by mouth daily., Disp: , Rfl:    nirmatrelvir/ritonavir EUA (PAXLOVID) 20 x 150 MG & 10 x 100MG  TABS, Take 3 tablets by mouth 2 (two) times daily for 5 days. Patient GFR is >60. Take nirmatrelvir (150 mg) two tablets twice daily for 5 days and ritonavir (100 mg) one tablet twice daily for 5 days., Disp: 30 tablet, Rfl: 0   pantoprazole (PROTONIX) 20 MG tablet, Take 1 tablet (20 mg total) by mouth 2 (two) times daily before a meal., Disp: 60 tablet, Rfl: 3     Objective:   Vitals:   11/28/21 1433  BP: 116/64  Pulse: (!) 102  Temp: 98.1 F (36.7 C)  TempSrc: Oral  SpO2: 96%  Weight: 172 lb 12.8 oz (78.4 kg)  Height: 5\' 3"  (1.6 m)    Estimated body mass index is 30.61 kg/m as calculated from  the following:   Height as of this encounter: 5\' 3"  (1.6 m).   Weight as of this encounter: 172 lb 12.8 oz (78.4 kg).  @WEIGHTCHANGE @  13/02/23   11/28/21 1433  Weight: 172 lb 12.8 oz (78.4 kg)     Physical Exam    General: No distress. Looks wll Neuro: Alert and Oriented x 3. GCS 15. Speech normal Psych: Pleasant Resp:  Barrel Chest - no.  Wheeze - no, Crackles - no, No overt respiratory distress CVS: Normal heart sounds. Murmurs - no Ext: Stigmata of Connective Tissue Disease - no HEENT: Normal upper airway. PEERL +. No post nasal drip  Assessment:       ICD-10-CM   1. COVID-19 long hauler manifesting chronic fatigue  G93.32 Basic metabolic panel   J18.8     2. Post-COVID syndrome  U09.9     3. History of 2019 novel coronavirus disease (COVID-19)  Z86.16          Plan:     Patient Instructions     ICD-10-CM   1. COVID-19 long hauler manifesting chronic fatigue  G93.32    U09.9     2. Post-COVID syndrome  U09.9     3. History of 2019 novel coronavirus disease (COVID-19)  Z86.16        You got 2nd covid and after initial irmpvoement covid long haul is back  Plan  - no evidence for this but we took shared decision making to give a course of paxlovid - we took shred decision making against metformin - check bmet 11/28/2021 - whil on paxlovid do not take aller-chlor, xyzal tablets  - cal [place] in 6-8 weeks and let us know hyou are doing  Followup  6 months or sooner if needed     SIGNATURE    Dr. Kalman Shan, M.D., F.C.C.P,  Pulmonary and Critical Care Medicine Staff Physician, [facility] Director - Interstitial Lung Disease  Program  Pulmonary Fibrosis [facility] at [facility] [place], [place], [address]  Pager: [phone], If no answer or between  15:00h - 7:00h: call [phone] Telephone: [phone]  3:17 PM 11/28/2021

## 2021-11-28 NOTE — Patient Instructions (Addendum)
ICD-10-CM   1. COVID-19 long hauler manifesting chronic fatigue  G93.32    U09.9     2. Post-COVID syndrome  U09.9     3. History of 2019 novel coronavirus disease (COVID-19)  Z86.16        You got 2nd covid and after initial irmpvoement covid long haul is back  Plan  - no evidence for this but we took shared decision making to give a course of paxlovid - we took shred decision making against metformin - check bmet 11/28/2021 - whil on paxlovid do not take aller-chlor, xyzal tablets  - cal [place] in 6-8 weeks and let us know hyou are doing  Followup  6 months or sooner if needed

## 2021-11-29 LAB — BASIC METABOLIC PANEL
BUN: 13 mg/dL (ref 6–23)
CO2: 29 mEq/L (ref 19–32)
Calcium: 9.3 mg/dL (ref 8.4–10.5)
Chloride: 103 mEq/L (ref 96–112)
Creatinine, Ser: 0.74 mg/dL (ref 0.40–1.20)
GFR: 85.35 mL/min (ref >60.00)
Glucose, Bld: 99 mg/dL (ref 70–99)
Potassium: 3.8 mEq/L (ref 3.5–5.1)
Sodium: 140 mEq/L (ref 135–145)

## 2022-05-04 IMAGING — CT CT CHEST HIGH RESOLUTION
3 of 7 series · 16 of 36 positions shown, 19 images · non-contrast
Comparison: None.

CLINICAL DATA: Post COVID, chronic cough

EXAM:
CT CHEST WITHOUT CONTRAST
TECHNIQUE: Multidetector CT imaging of the chest was performed following the
standard protocol without intravenous contrast. High resolution
imaging of the lungs, as well as inspiratory and expiratory imaging,
was performed.

[Series 4: thorax standard chest · axial · 0.60mm/px · z∈[-263,-201]mm · 2 of 157 slices shown]
[im 32/157  mediastinal]
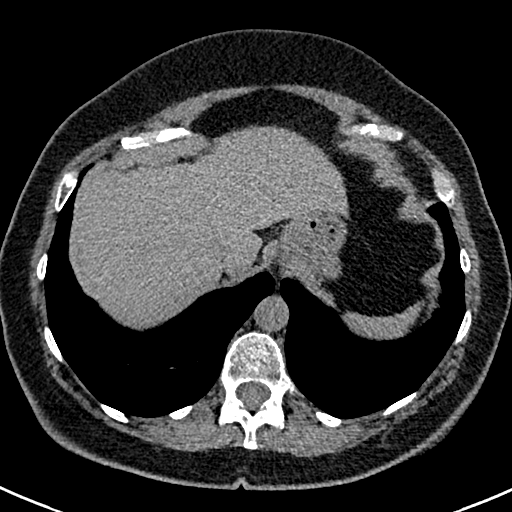
[im 63/157  mediastinal]
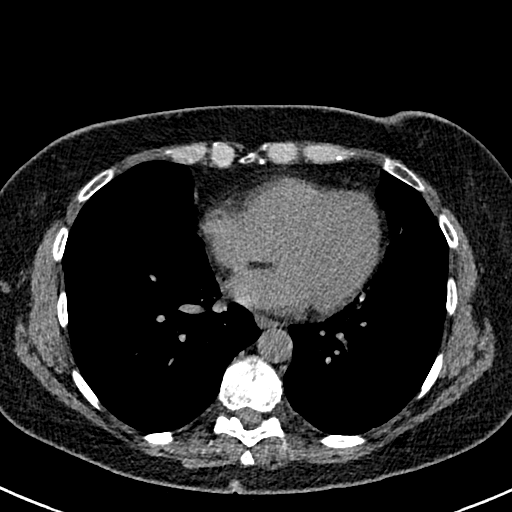

[Series 7: high resolution retro · axial · 0.60mm/px · z∈[-299,-39]mm · 11 of 313 slices shown, 14 images]
[im 27/313  mediastinal]
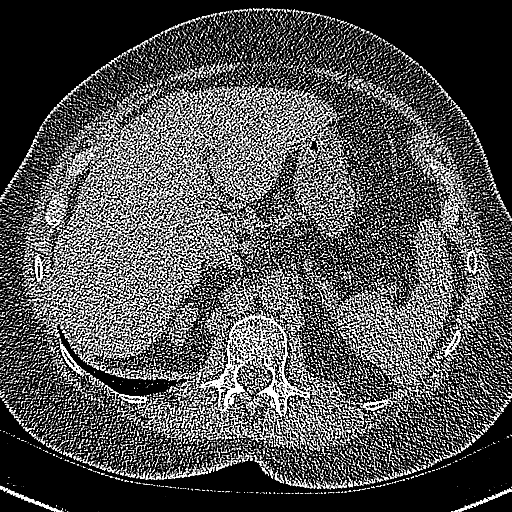
[im 27/313  lung]
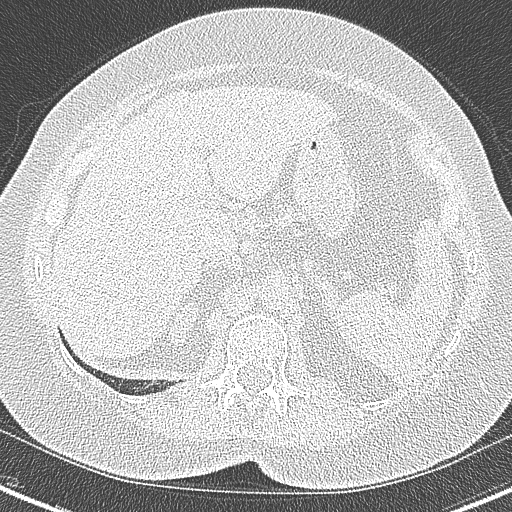
[im 53/313  lung]
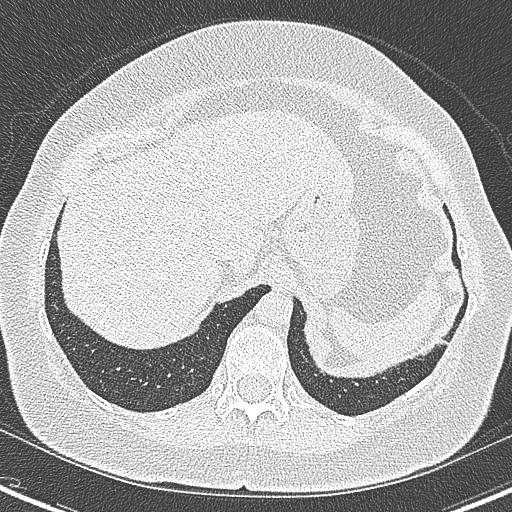
[im 79/313  lung]
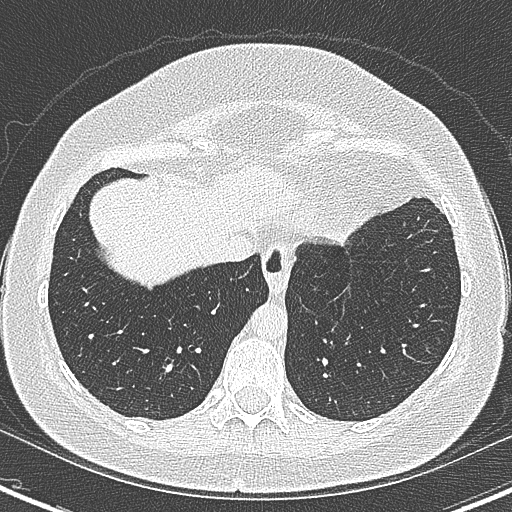
[im 105/313  lung]
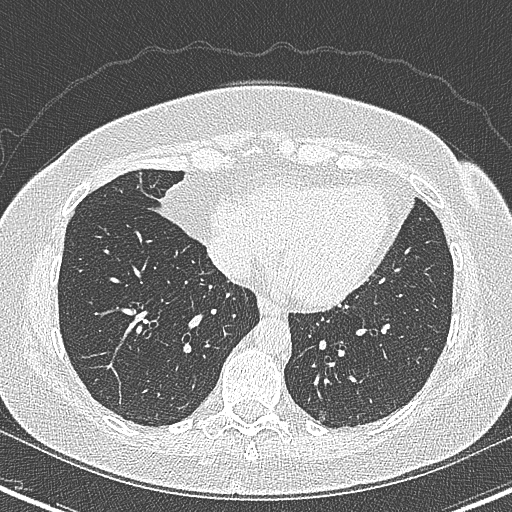
[im 131/313  mediastinal]
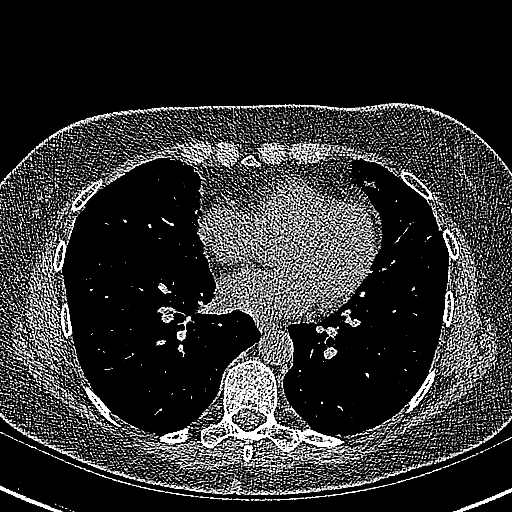
[im 131/313  lung]
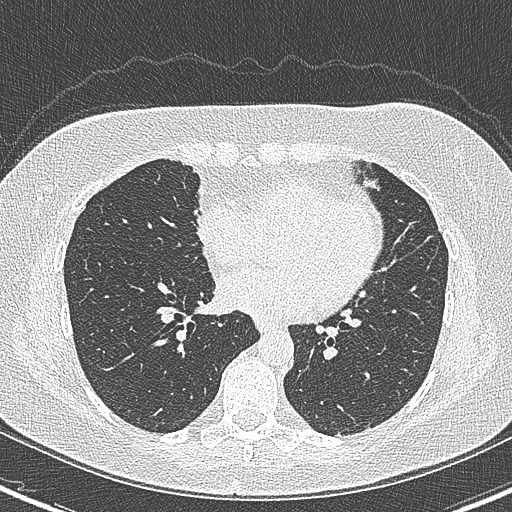
[im 157/313  lung]
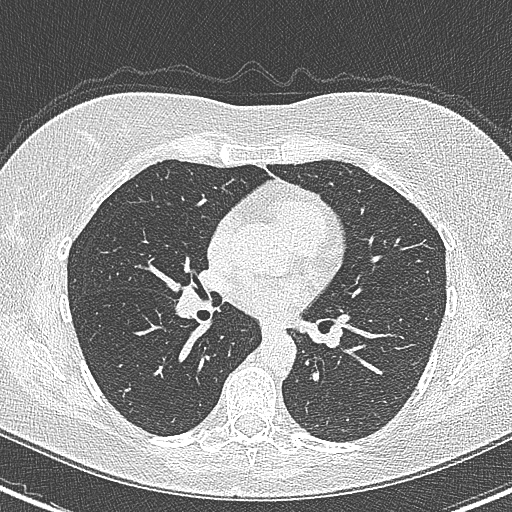
[im 183/313  lung]
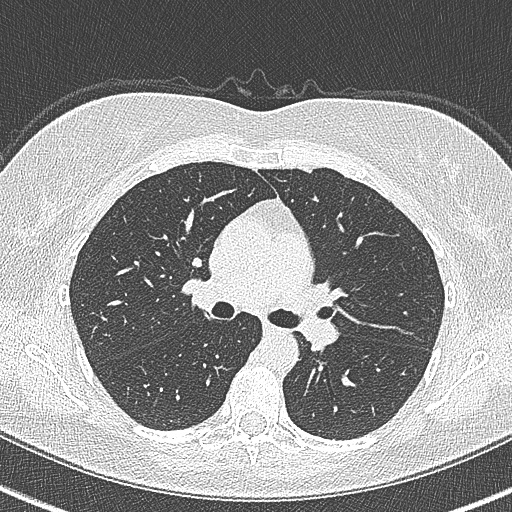
[im 209/313  lung]
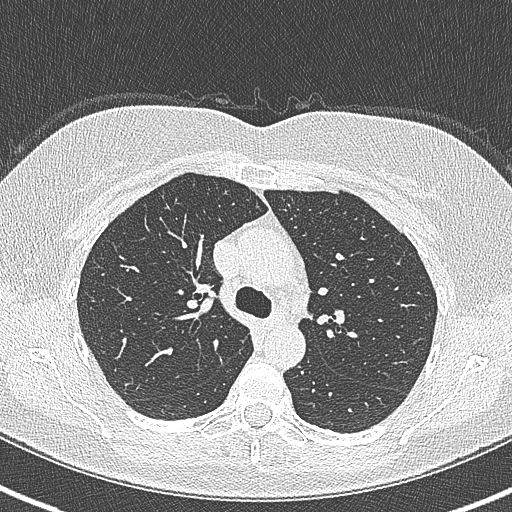
[im 235/313  mediastinal]
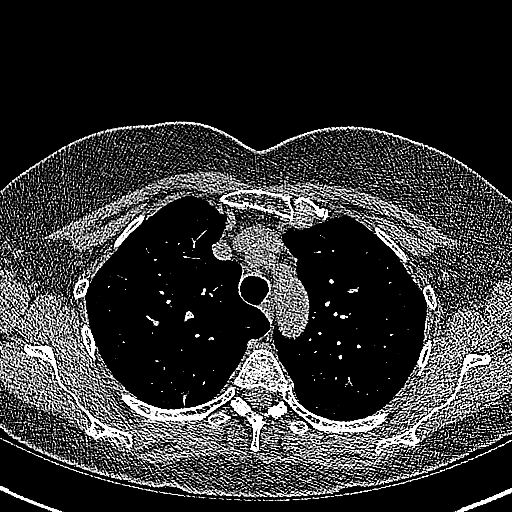
[im 235/313  lung]
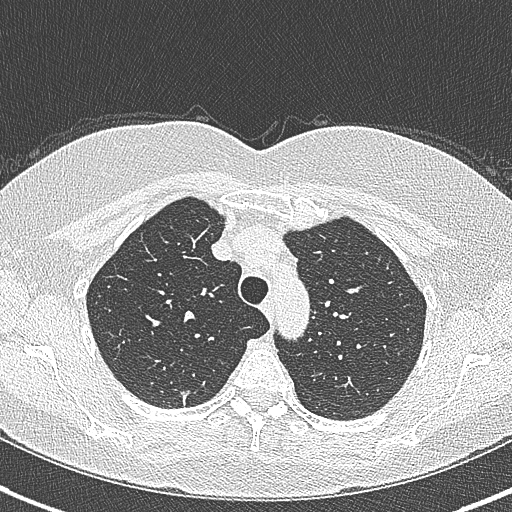
[im 261/313  lung]
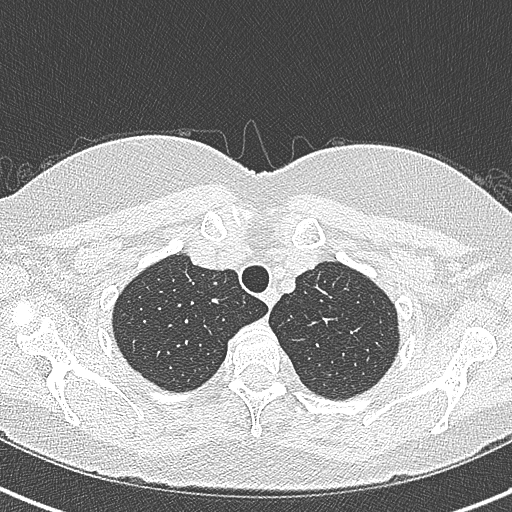
[im 287/313  lung]
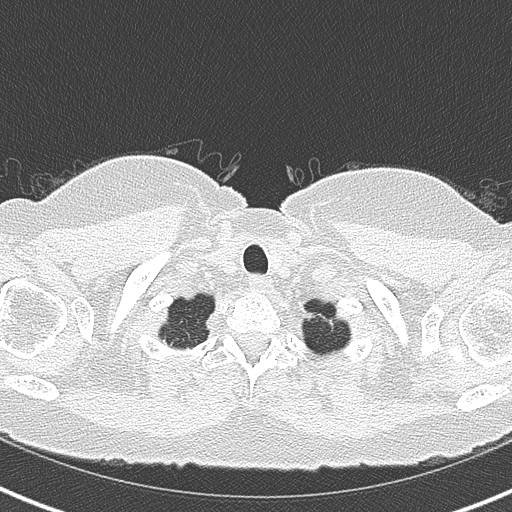

[Series 8: coronal · coronal · 0.56mm/px · 3 of 115 slices shown]
[im 23/115  lung]
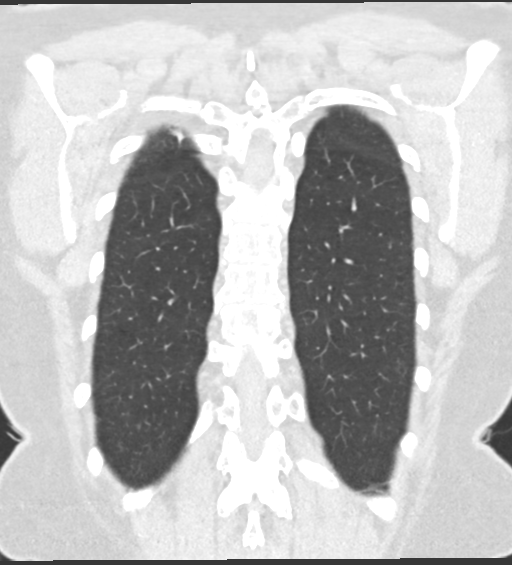
[im 46/115  lung]
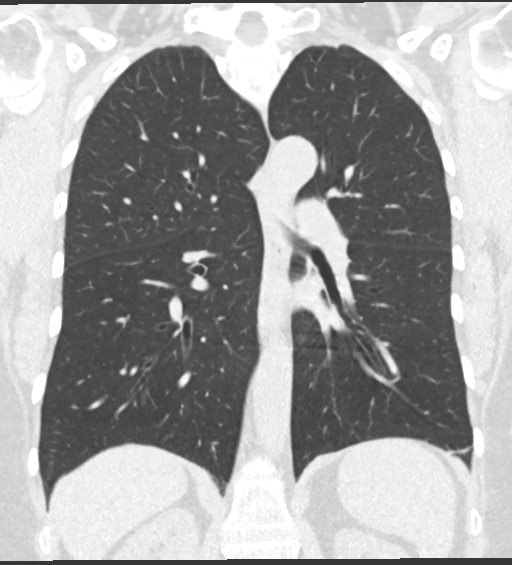
[im 69/115  lung]
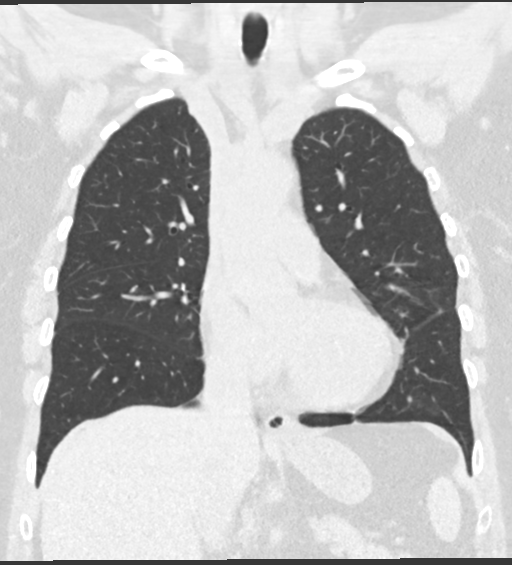

[16 of 36 positions shown; findings below may reference images not displayed]

FINDINGS: Cardiovascular: Scattered aortic atherosclerosis. Normal heart size.
No pericardial effusion.

Mediastinum/Nodes: No enlarged mediastinal, hilar, or axillary lymph
nodes. Thyroid gland, trachea, and esophagus demonstrate no
significant findings.

Lungs/Pleura: Minimal, bland appearing bandlike scarring of
bilateral lung bases. No evidence of fibrotic interstitial lung
disease. No significant air trapping on expiratory imaging. No
pleural effusion or pneumothorax.

Upper Abdomen: No acute abnormality. Coarse, nodular contour of the
liver in the included abdomen. Status post cholecystectomy.

Musculoskeletal: No chest wall mass or suspicious bone lesions
identified.
IMPRESSION: 1. Minimal, bland appearing bandlike scarring of the bilateral lung
bases. No evidence of fibrotic interstitial lung disease.
2. This coarse somewhat nodular contour of the liver in the included
upper abdomen, suggestive of cirrhosis.
3. Aortic atherosclerosis.

Aortic Atherosclerosis ([id number]).

## 2022-06-27 ENCOUNTER — Ambulatory Visit: Payer: BC Managed Care – PPO | Admitting: Internal Medicine

## 2022-07-18 ENCOUNTER — Ambulatory Visit: Payer: Medicare HMO | Admitting: Internal Medicine

## 2022-09-08 ENCOUNTER — Ambulatory Visit: Payer: Medicare HMO | Admitting: Internal Medicine

## 2022-09-08 ENCOUNTER — Encounter: Payer: Self-pay | Admitting: Internal Medicine

## 2022-09-08 VITALS — BP 120/70 | HR 81 | Ht 63.0 in | Wt 168.0 lb

## 2022-09-08 DIAGNOSIS — G9332 Myalgic encephalomyelitis/chronic fatigue syndrome: Secondary | ICD-10-CM

## 2022-09-08 DIAGNOSIS — U099 Post covid-19 condition, unspecified: Secondary | ICD-10-CM

## 2022-09-08 DIAGNOSIS — R053 Chronic cough: Secondary | ICD-10-CM

## 2022-09-08 DIAGNOSIS — R0609 Other forms of dyspnea: Secondary | ICD-10-CM

## 2022-09-08 LAB — CBC WITH DIFFERENTIAL/PLATELET
Basophils Absolute: 0.1 10*3/uL (ref 0.0–0.1)
Basophils Relative: 1.6 % (ref 0.0–3.0)
Eosinophils Absolute: 0.4 10*3/uL (ref 0.0–0.7)
Eosinophils Relative: 4.5 % (ref 0.0–5.0)
HCT: 44.1 % (ref 36.0–46.0)
Hemoglobin: 14.4 g/dL (ref 12.0–15.0)
Lymphocytes Relative: 31.7 % (ref 12.0–46.0)
Lymphs Abs: 2.5 10*3/uL (ref 0.7–4.0)
MCHC: 32.7 g/dL (ref 30.0–36.0)
MCV: 90 fl (ref 78.0–100.0)
Monocytes Absolute: 0.6 10*3/uL (ref 0.1–1.0)
Monocytes Relative: 7.5 % (ref 3.0–12.0)
Neutro Abs: 4.3 10*3/uL (ref 1.4–7.7)
Neutrophils Relative %: 54.7 % (ref 43.0–77.0)
Platelets: 317 10*3/uL (ref 150.0–400.0)
RBC: 4.89 Mil/uL (ref 3.87–5.11)
RDW: 14 % (ref 11.5–15.5)
WBC: 7.9 10*3/uL (ref 4.0–10.5)

## 2022-09-08 LAB — POCT EXHALED NITRIC OXIDE: FeNO level (ppb): 29

## 2022-09-08 NOTE — Patient Instructions (Addendum)
ICD-10-CM   1. COVID-19 long hauler manifesting chronic fatigue  G93.32    U09.9     2. COVID-19 long hauler manifesting chronic dyspnea  R06.09    U09.9     3. Chronic cough  R05.3       Glad over time COVID long-haul symptoms are improving but he still have some residual cough  Plan  - Check CBC with differential and blood IgE 09/08/2022 -Check feno test 09/08/2022 -Continue physical therapy and strengthening exercises -Continue following healthy lifestyle -9 months do spirometry and DLCO - continue breo  Followup  9 months or sooner if needed

## 2022-09-08 NOTE — Progress Notes (Signed)
OV 11/29/2019  Subjective:  Patient ID: Mary Orr, female , DOB: 05/24/1955 , age 67 y.o. , MRN: [id number] , ADDRESS: [address] [place] [address] PCP Eartha Inch, MD Patient Care Team: Eartha Inch, MD as PCP - General (Family Medicine)  This Provider for this visit: Treatment Team:  Attending Provider: Kalman Shan, MD  11/29/2019 -   Chief Complaint  Patient presents with   Consult    Post-covid, long-hauler cough, Covid las   HPI Mary Orr 67 y.o. - with a PMHx of allergies following by Dr. Irena Cords, COVID-19 pneumonia in October 2020 with post-COVID-19 syndrome, hypothyroidism, GERD who is here today for evaluation of chronic cough. Mary Orr states that she first contracted COVID-19 in October 23020 and was hospitalized for a short period of time. After discharge, she continued to experience significant fatigue with excessive sleepiness that lasted up till 1.5 months ago. During this time, she was sleeping approximately 10-12 hours per day. She also experienced both dry and productive cough that is worst in the morning but occurs at night, at rest and with exertion throughout the day. The cough has improved somewhat but has still persisted. It is not necessarily improved with rest or drinking sips of water. Mary Orr states that her family has mentioned she is coughing in the night-time but she does not always wake for it. In the past year, Mary Orr has also experienced persistent SOB that is worsened with exertion. For these symptoms, she has followed up with Dr. Jerre Simon in [place]. At the time, she underwent PFTs and lab work (please see below), that was largely negative. Mary Orr states she was told her symptoms may be related to underlying sleep disorder and recommendation was for sleep studies, however she has decided not to complete these after following up with her PCP. Overall, her fatigue and GI symptoms from COVID-19 improved  the most, however her cough and SOB have persisted.   In the past 2 weeks, Mary Orr states she has also experienced a viral URI that affected her entire family. She has worsening in her cough and SOB during this time. Her PCP prescribed her Amoxicillin and Prednisone; she completed the Amoxicillin course 4 days, however did not begin the Prednisone pack. She has begun to feel better at this time.   She denies any previous history of lung disease other than asthma. No family history of ILD or rheumatological disorders.   PFTs (04/27/2019): Read by Dr. Jerre Simon as normal on [facility]. Unable to obtain full results.   CTA (10/29/2018) IMPRESSION:  1.  No pulmonary embolism.  2.  No aortic aneurysm or dissection.  3.  Peripheral areas of interstitial thickening/scarring bilaterally. Multifocal groundglass opacity likely represent superimposed atypical viral pneumonia..   Echo (12/28/2018)  Normal left ventricular structure and size. Wall thickness is normal.  Systolic function is normal with an ejection fraction of 55-65%. Wall  motion is within normal limits. Unable to assess diastolic function. There  is no thrombus.  Complement C3, Serum 82 - 167 mg/dL 621        C4 Complement 12 - 38 mg/dL 19        ANA Direct Negative  Negative        RA Quant 0.0 - 13.9 IU/mL <10.0        Anti-DNA (DS) Ab Qn 0 - 9 IU/mL <1  Negative      <5                                     Equivocal  5 - 9                                     Positive      >9      Sjogren's Antibodies(SSA) 0.0 - 0.9 AI <0.2        Sjogren's Antibodies(SSB) 0.0 - 0.9 AI <0.2        RNP Antibodies 0.0 - 0.9 AI 0.4        Smith Antibodies 0.0 - 0.9 AI <0.2     Antimyeloperoxidase (MPO) Abs 0.0 - 9.0 U/mL <9.0        Antiproteinase 3 (PR-3) Abs 0.0 - 3.5 U/mL <3.5        C-ANCA Neg:<1:20 titer <1:20        P-ANCA Neg:<1:20 titer <1:20     Total IgG 586 - 1,602 mg/dL 161      IgA  87 - 096 mg/dL 045      IgM 26 - 409 mg/dL 811 High       Immunoglobulin E, Total 6 - 495 IU/mL 4 Low      Dr Gretta Cool Reflux Symptom Index (> 13-15 suggestive of LPR cough) 0 -> 5  =  none ->severe problem  Hoarseness of problem with voice 1  Clearing  Of Throat 1  Excess throat mucus or feeling of post nasal drip 3  Difficulty swallowing food, liquid or tablets 3  Cough after eating or lying down 2  Breathing difficulties or choking episodes 1  Troublesome or annoying cough 4  Sensation of something sticking in throat or lump in throat 1  Heartburn, chest pain, indigestion, or stomach acid coming up 3    xxxx    12/23/2019  - Visit   67 year old female never smoker followed in our office for chronic cough and post Covid syndrome.  She is established with Dr. Marchelle Gearing.  She was last seen on 11/29/2019.  It was recommended at that office visit that she obtain a high-resolution CT chest, obtain pulmonary function testing and continue to follow-up with ENT in [place], continue follow-up with Dr. Madie Reno for allergy asthma.  Patient presenting today after completing pulmonary function testing those results are listed below:  12/23/2019-pulmonary function test-FVC 2.65 (84% predicted), postbronchodilator ratio 88, postbronchodilator FEV1 2.48 (103% predicted), positive bronchodilator response in the FEV1, TLC 5.27 (107% predicted), DLCO 23.31 (120% predicted)  Per consult note on 11/29/2019 patient was diagnosed with COVID-19 pneumonia in October/2020.  She was hospitalized.  Patient reporting today she never required a mechanical ventilator.  She did receive steroids.  She also received high flow nasal cannula as initial oxygenation when hospitalized was in the low 80s.  Patient is a [employer] representative.  She is also noticed that over the last year she has had ongoing difficulty with singing.  Prior to COVID-19 infection patient was very active walking between 5 to 10 miles a day.   She reports that she is unable to even walk closer to a mile at this point in time.  She still deals with ongoing fatigue.  She did not notice or remember a significant improvement in her symptoms when she was  taking Qvar.  She takes Qvar seasonally as managed by Dr. Madie Reno her allergist.  She also takes Xyzal.  She has considerable amount of postnasal drip.  She reports a cough most evenings when laying flat.  Questionaires / Pulmonary Flowsheets:   ACT:  No flowsheet data found.  MMRC: No flowsheet data found.  Epworth:  No flowsheet data found.  Tests:   12/05/2019-CT chest high-res-faint subpleural groundglass in the left lower lobe is nonspecific but may reflect subtle post COVID-19 inflammatory fibrosis, otherwise no evidence of interstitial lung disease, mild air trapping is indicative of small airways disease, marginal irregularity in the liver is indicative of cirrhosis, aortic arthrosclerosis  12/23/2019-pulmonary function test-FVC 2.65 (84% predicted), postbronchodilator ratio 88, postbronchodilator FEV1 2.48 (103% predicted), positive bronchodilator response in the FEV1, TLC 5.27 (107% predicted), DLCO 23.31 (120% predicted)    OV 02/29/2020  Subjective:  Patient ID: Mary Orr, female , DOB: 01/28/1955 , age 37 y.o. , MRN: [id number] , ADDRESS: [address] [place] [address] PCP Eartha Inch, MD Patient Care Team: Eartha Inch, MD as PCP - General (Family Medicine)  This Provider for this visit: Treatment Team:  Attending Provider: Kalman Shan, MD    02/29/2020 -   Chief Complaint  Patient presents with   Follow-up    Doing better    Follow-up post COVID pneumonia October 2020 with admission  HPI Mary Orr 67 y.o. -last seen in November 2021 after that she followed up with nurse practitioner.  At that time subtle early ILD changes were noted on the CT scan of the chest.  She tells me that she has continued to improve in  terms of her cough and shortness of breath.  She has an occasional light blue color changes to her lips.  At the time she checks oxygen saturation with the finger and this is normal.  She denies similar discoloration of her hands or fingers.  Denies that her hands turn cold.  Cough and shortness of breath improving.  Symptom score is listed below.  She is concerned about the ILD changes in the CT scan of the chest and inquired about it.  Overall she feels her course is one of significant improvement.  She is relieved that she is improving.       OV 11/08/2020  Subjective:  Patient ID: Mary Orr, female , DOB: [birth year] , age 89 y.o. , MRN: [id number] , ADDRESS: [address] [place] [address] PCP Eartha Inch, MD Patient Care Team: Eartha Inch, MD as PCP - General (Family Medicine)  This Provider for this visit: Treatment Team:  Attending Provider: Kalman Shan, MD    11/08/2020 -   Chief Complaint  Patient presents with   Follow-up    Pt states she has been doing better since last visit. States her cough is also better.   Follow-up post-COVID pneumonia admission October 2020   HPI Mary Orr 67 y.o. -is now 2 years since she got admitted.  She has been following with [place] come 1 years this November 2022.  She tells me that overall 6 dyspnea on exertion might be some better but she is not sure.  The main issue she tells me is that 2 months prior to her COVID in October 2020 she was diagnosed with allergic asthma and given Qvar.  Then she got COVID with hospitalization in October 2020.  This required oxygen therapy.  Then she spent much of 2021  with a cough.  When she saw Elisha Headland the nurse practitioner [phone] he gave her Symbicort.  This seemed to help but in the year 2022 she stopped in the spring 2022 after she saw me in February 2022.  That she got a viral infection that required prednisone and antibiotics.  Then she went back on Symbicort and then  stopped it again and then by summer 2022 had another bad respiratory infection that required antibiotic and prednisone again.  She says these episodes were severe.  Since July 2022 she has not been on any Symbicort.  No inhaled steroids.  She is asking if she should be taking Symbicort again.  She says 2 weeks ago she saw Dr. Madie Reno and has been recommended to stay on Symbicort.  She says she is only realizing now that is a maintenance inhaler.  She does seem to think that during Symbicort she was not getting respiratory infections.  She somewhat feels that the absence of Symbicort is making her prone to respiratory infections.  She still has fixed dyspnea on exertion.  She thinks is some improved compared to a year ago but nowhere near pre-COVID.  In fact she tells me that pre-COVID she used to walk 5 miles but now she is barely able to make a mile.   She is not sure if the Symbicort earlier in the year was actually helping her fixed dyspnea.  Current symptom score is listed below and clearly shows that she only might be marginally better compared to a year ago  In addition she is reporting episodes of exhaustion.  These happen randomly.  During this time all her symptoms get accentuated.  This characterized by worsening of the same symptoms as below..    Scymbicort $300 - too expensive. Wants alternative  PFT  OV 01/04/2021  Subjective:  Patient ID: Mary Orr, female , DOB: 1955-07-13 , age 54 y.o. , MRN: [id number] , ADDRESS: [address] [place] [address] PCP Eartha Inch, MD Patient Care Team: Eartha Inch, MD as PCP - General (Family Medicine)  This Provider for this visit: Treatment Team:  Attending Provider: Coral Ceo, NP    01/04/2021 -   Chief Complaint  Patient presents with   Follow-up    Went to urgent care 3 weeks ago.    Follow-up post COVID long-haul symptoms  HPI Mary Orr 67 y.o. -she has had extensive work-up.  CT scan of the  chest does show some postinflammatory scarring in the bases.  Very minimal.  No ILD.  Had cardiopulmonary exercise stress test VO2 Max adequate normal for sedentary person.  This correction on the VO2 based on ideal body weight.  This suggest obesity as the reason for dyspnea.  Pulmonary function test is normal.  Overall she is feels some better but then she again continues to report alternating feelings of being good and then having episodes where she feels like her whole body is crashing and she is significantly symptomatic.  This happens 5 or 7 times a month.  However symptoms seem the same according to history but her nadir symptoms seem better.  She states 1 week prior to this visit she had a day when she thought she was coming down with the flu after getting over a cold or bronchitis.  She feels her body was flushed and coughing she went to bed several times and missed work but then the next morning she was completely fine.  This is  how she describes the crash.    So far objective testing only shows obesity related dyspnea.  She was on Breo but she not on it currently.  Her current body weight is 170 pounds.  For a BMI of 25 her weight should be 145 pounds.  Dec 2022  01/03/21 15:40  FeNO level (ppb) 27     IMPRESSION: ct chest high res 1. Minimal, bland appearing bandlike scarring of the bilateral lung bases. No evidence of fibrotic interstitial lung disease. 2. This coarse somewhat nodular contour of the liver in the included upper abdomen, suggestive of cirrhosis. 3. Aortic atherosclerosis.   Aortic Atherosclerosis (ICD10-I70.0).     Electronically Signed   By: Jearld Lesch M.D.   On: 12/11/2020 14:59    Cpex 11/15/2    Interpretation   Notes: Patient gave an excellent effort. Pulse-oximetry remained 96-98% for the duration of exercise. Exercise was performed on a cycle ergometer starting at [facility] and increasing by 10W/min.   ECG:  Resting ECG in normal sinus rhythm. HR  response appropriate. There were no sustained arrhythmias or ST-T changes. BP response appropriate.   PFT:  Pre-exercise spirometry was within normal limits. The MVV was normal. Post-exercise spirometry within normal limits.   CPX:  Exercise Capacity- Exercise testing with gas exchange demonstrates a normal peak VO2 of 15.2 ml/kg/min (85% of the age/gender/weight matched sedentary norms). The RER of 1.11 indicates a maximal effort. When adjusted to the patient's ideal body weight of 138.6 lb (62.9 kg) the peak VO2 is 18.3 ml/kg (ibw)/min (102% of the ibw-adjusted predicted).   Cardiovascular response- The O2pulse (a surrogate for stroke volume) increased with incremental exercise reaching peak at 9 ml/beat (100% predicted). DeltaVO2/Delta WR is normal at 10 Indicating No evidence of cardiovascular impairment.   Ventilatory response- The VE/VCO2 slope normal. The oxygen uptake efficiency slope (OUES) is normal. The VO2 at the ventilatory threshold was normal at 62% of the predicted peak VO2. At peak exercise, the ventilation reached 33% of the measured MVV and breathing reserve was 78 indicating ventilatory reserve remained.  PETCO2 was  normal at 36 mmHg during peak exercise.      Conclusion: Exercise testing with gas exchange demonstrates normal functional capacity when compared to matched sedentary norms. There is no indication for cardiopulmonary abnormality or exercise-induced bronchospasm. Corrections for ideal body weight suggest body habitus is contributing to exercise intolerance.    Test, report and preliminary impression by:  Reggy Eye, MS, ACSM-RCEP  11/27/2020 3:50 PM    FINALIZED.  Chilton Greathouse MD  [facility]  12/04/2020, 5:18 PM      OV 11/28/2021  Subjective:  Patient ID: Mary Orr, female , DOB: [birth year] , age 14 y.o. , MRN: [id number] , ADDRESS: [address] [place] [address] PCP Eartha Inch, MD Patient Care  Team: Eartha Inch, MD as PCP - General (Family Medicine)  This Provider for this visit: Treatment Team:  Attending Provider: Kalman Shan, MD    11/28/2021 -   Chief Complaint  Patient presents with   Follow-up    Pt states she is about the same since last visit.     HPI Mary Orr 67 y.o. -returns for follow-up of post COVID long-haul.  She tells me January 2023 she got COVID again and was treated with Paxlovid after that she started feeling really well for 3 months.  She felt her long-haul symptoms all resolved.  She believes the viral  clearance resulted in resolution of the long-haul symptoms.  Then in March 2023 she started attending pulmonary rehabilitation.  She says she pushed herself really hard and then she developed one of her classic crash with physical exertion that she slept for 30 hours.  She missed rehab.  Slowly recovered and then went back to rehab.  Then did rehab at a lower intensity then by Easter 2023 started ramping up on rehab but then had another "crash" and then was fatigued and sleeping a lot.  After that again slowly improved effort tolerance at rehab and then had another crash again.  Since then she is not into rehab.  Is been few to several months since she attended rehab.  Nevertheless she feels her "crash cycles" are getting better.  The intensity of fatigue is less the duration of fatigue is less.  She is quite interested in repeat of empiric Paxlovid treatment because of the theory of circulating very on particles and a previous personal experience.  We discussed some literature with metformin which is shown to be beneficial in reducing long-haul development but is again given in the immediate COVID acute illness setting.  She is not interested in this.  We took a shared decision that there is no literature to support taking Paxlovid at this point but given the low risk profile [last creatinine check 2020] and previous tolerance with it it would be  okay.  I did a quick med review and there are no contraindicated meds    OV 09/08/2022  Subjective:  Patient ID: Mary Orr, female , DOB: 03-30-1955 , age 52 y.o. , MRN: [id number] , ADDRESS: [address] [place] [address] PCP Erenest Blank, NP Patient Care Team: Erenest Blank, NP as PCP - General (Nurse Practitioner)  This Provider for this visit: Treatment Team:  Attending Provider: Kalman Shan, MD    09/08/2022 -   Chief Complaint  Patient presents with   Follow-up    6 months f/up, no cpmplaints     HPI Mary Orr 67 y.o. -presents for follow-up of COVID long-haul dyspnea.  She has unique features of COVID long-haul where she gets good days and bad days.  She calls these bad days "crash days".  She states since her last visit the frequency of crash days have reduced and the intensity of these crashes have reduced.  She can get significant fatigue and also shortness of breath and cough.  Otherwise she is fine.  Recently she started singing back in the choir and she had significant improvement in effort tolerance.  She felt she was not as dyspneic as she used to be post COVID.  She also went to [place] taking her family in July 2024 and she did really well with this trip.  Nevertheless there is still crashed is happening.  She is also undergoing physical therapy for muscle strengthening exercises.  Re cough  - prior eos 500cellc/cum  - feno 09/08/2022 - 29 ppbb on breo and in grey zone  Social - Husband is getting immunotherapy for melanoma.  2 years ago he had major back surgery and fusion of 12 vertebrae.     SYMPTOM SCALE - Nov 2021 02/29/2020  11/08/2020 abaseline 11/08/2020 During exhaustion episodes 01/03/21 regular day 01/03/21 during crash days 09/08/2022 Good ay 09/08/2022 Cras day  O2 use  ra        Shortness of Breath  0 -> 5 scale with 5 being worst (score 6 If unable to  do)        At rest 0 0 1 2.5 0 4 0 2  Simple tasks - showers,  clothes change, eating, shaving 0 0 1 2 1 4  0 3  Household (dishes, doing bed, laundry) 2 1.5 1.5 3 1 5 1 3   Shopping 1 2 1 3 1 4 1    Walking level at own pace 2 2 1.5 4 1 4 1 2   Walking up Stairs 3 3.5 1 3 2 5 2 4   Total (30-36) Dyspnea Score 8 9 7  17.5 6 26 5 14   How bad is your cough? 2 0.5 0. Bad during episodes 1  1 3   How bad is your fatigue 1 0.5 1 4 1 4 1 4   How bad is nausea  0 0 0 0 1 0 0  How bad is vomiting?   00 0 0 0 0 0 0  How bad is diarrhea?  0 0 0 0 0 0 0  How bad is anxiety?  0 0 0 0 3 0 1  How bad is depression  0 0 00 0 3 0 0  0  No results found for: "NITRICOXIDE"   PFT     Latest Ref Rng & Units 12/23/2019    9:03 AM  ILD indicators  FVC-Pre L 2.65   FVC-Predicted Pre % 84   FVC-Post L 2.83   FVC-Predicted Post % 90   TLC L 5.27   TLC Predicted % 107   DLCO uncorrected ml/min/mmHg 23.31   DLCO UNC %Pred % 120   DLCO Corrected ml/min/mmHg 23.31   DLCO COR %Pred % 120     Latest Reference Range & Units 07/26/18 00:51  Eosinophils Absolute 0.0 - 0.5 K/uL 0.5     LAB RESULTS last 96 hours No results found.  LAB RESULTS last 90 days No results found for this or any previous visit (from the past 2160 hour(s)).       has a past medical history of Thyroid disease.   reports that she has never smoked. She has never used smokeless tobacco.  Past Surgical History:  Procedure Laterality Date   BACK SURGERY     NASAL SINUS SURGERY      No Known Allergies  Immunization History  Administered Date(s) Administered   Influenza,inj,Quad PF,6+ Mos 10/28/2016, 10/29/2017, 12/23/2019   Influenza,trivalent, recombinat, inj, PF 12/01/2011   PFIZER(Purple Top)SARS-COV-2 Vaccination 05/03/2019, 05/24/2019, 10/17/2019   Tdap 05/31/2015    No family history on file.   Current Outpatient Medications:    albuterol (PROVENTIL) (2.5 MG/3ML) 0.083% nebulizer solution, Take by nebulization., Disp: , Rfl:    albuterol (VENTOLIN HFA) 108 (90 Base)  MCG/ACT inhaler, Inhale 2 puffs into the lungs every 6 (six) hours as needed. PRN, Disp: , Rfl:    aspirin 81 MG EC tablet, Take by mouth., Disp: , Rfl:    azelastine (ASTELIN) 0.1 % nasal spray, 1 spray., Disp: , Rfl:    buPROPion (WELLBUTRIN XL) 150 MG 24 hr tablet, TAKE 1 TABLET(150 MG) BY MOUTH DAILY, Disp: , Rfl:    chlorpheniramine (ALLER-CHLOR) 4 MG tablet, TAKE 1-2 TABLETS AT NIGHT - FOR MANAGEMENT OF ALLERGIES AND POSTNASAL DRIP AT NIGHT, Disp: 60 tablet, Rfl: 3   Diclofenac Sodium CR 100 MG 24 hr tablet, Take by mouth., Disp: , Rfl:    EPINEPHrine 0.3 mg/0.3 mL IJ SOAJ injection, Inject 0.3 mLs (0.3 mg total) into the muscle as needed for anaphylaxis., Disp: 1 each, Rfl: 0  fluticasone furoate-vilanterol (BREO ELLIPTA) 100-25 MCG/INH AEPB, Inhale 1 puff into the lungs daily., Disp: 14 each, Rfl: 0   levocetirizine (XYZAL) 5 MG tablet, Take by mouth., Disp: , Rfl:    levothyroxine (SYNTHROID) 50 MCG tablet, Take by mouth., Disp: , Rfl:    Multiple Vitamin (MULTIVITAMIN) capsule, Take 1 capsule by mouth daily., Disp: , Rfl:    pantoprazole (PROTONIX) 20 MG tablet, Take 1 tablet (20 mg total) by mouth 2 (two) times daily before a meal., Disp: 60 tablet, Rfl: 3      Objective:   Vitals:   09/08/22 1044  BP: 120/70  Pulse: 81  SpO2: 95%  Weight: 168 lb (76.2 kg)  Height: 5\' 3"  (1.6 m)    Estimated body mass index is 29.76 kg/m as calculated from the following:   Height as of this encounter: 5\' 3"  (1.6 m).   Weight as of this encounter: 168 lb (76.2 kg).  @WEIGHTCHANGE @  [employer]   09/08/22 1044  Weight: 168 lb (76.2 kg)     Physical Exam   General: No distress.  O2 at rest: no Cane present: no Sitting in wheel chair: no Frail: no Obese: no Neuro: Alert and Oriented x 3. GCS 15. Speech normal Psych: Pleasant Resp:  Barrel Chest - no.  Wheeze - no, Crackles - no, No overt respiratory distress CVS: Normal heart sounds. Murmurs - no Ext: Stigmata of  Connective Tissue Disease - no HEENT: Normal upper airway. PEERL +. No post nasal drip        Assessment:       ICD-10-CM   1. COVID-19 long hauler manifesting chronic fatigue  G93.32    U09.9     2. COVID-19 long hauler manifesting chronic dyspnea  R06.09    U09.9     3. Chronic cough  R05.3          Plan:     Patient Instructions     ICD-10-CM   1. COVID-19 long hauler manifesting chronic fatigue  G93.32    U09.9     2. COVID-19 long hauler manifesting chronic dyspnea  R06.09    U09.9     3. Chronic cough  R05.3       Glad over time COVID long-haul symptoms are improving but he still have some residual cough  Plan  - Check CBC with differential and blood IgE 09/08/2022 -Check feno test 09/08/2022 -Continue physical therapy and strengthening exercises -Continue following healthy lifestyle -9 months do spirometry and DLCO  Followup  9 months or sooner if needed   FOLLOWUP Return in about 9 months (around 06/08/2023) for 15 min visit, Face to Face Visit, with Dr Marchelle Gearing.    SIGNATURE    Dr. Kalman Shan, M.D., F.C.C.P,  Pulmonary and Critical Care Medicine Staff Physician, [facility] [facility] Director - Interstitial Lung Disease  Program  Pulmonary Fibrosis [facility] Network at [facility] [place], [place], [address]  Pager: [phone], If no answer or between  15:00h - 7:00h: call [phone] Telephone: [phone]  11:12 AM 09/08/2022

## 2022-09-13 ENCOUNTER — Telehealth: Payer: Self-pay | Admitting: Internal Medicine

## 2022-09-13 NOTE — Telephone Encounter (Signed)
    Let Mary Orr know that Blood IgE is normal but blood eos slightly high suggesting there might be an allergic component to her cough.  Plan  - if she has never had allergy skin test, we can do a "deep dive" blood test - she can do a RAST Allergy panel test at her convenience

## 2022-09-15 NOTE — Telephone Encounter (Signed)
Pt has had allergy skin test , w Dr. Johnn Hai

## 2022-09-15 NOTE — Telephone Encounter (Signed)
ATC X1 LVM for patient Need to know if patient has ever had allergy skin test. Will place order if not

## 2022-09-26 NOTE — Telephone Encounter (Signed)
Please get t hose results for me from Dr Madie Reno

## 2023-10-20 ENCOUNTER — Encounter: Payer: Self-pay | Admitting: Internal Medicine

## 2023-10-20 ENCOUNTER — Ambulatory Visit: Admitting: Internal Medicine

## 2023-10-20 VITALS — BP 112/74 | HR 85 | Ht 63.0 in | Wt 163.0 lb

## 2023-10-20 DIAGNOSIS — R053 Chronic cough: Secondary | ICD-10-CM

## 2023-10-20 DIAGNOSIS — U099 Post covid-19 condition, unspecified: Secondary | ICD-10-CM | POA: Diagnosis not present

## 2023-10-20 DIAGNOSIS — G9332 Myalgic encephalomyelitis/chronic fatigue syndrome: Secondary | ICD-10-CM | POA: Diagnosis not present

## 2023-10-20 DIAGNOSIS — R0609 Other forms of dyspnea: Secondary | ICD-10-CM

## 2023-10-20 NOTE — Patient Instructions (Addendum)
 ICD-10-CM   1. COVID-19 long hauler manifesting chronic dyspnea  R06.09    U09.9     2. COVID-19 long hauler manifesting chronic fatigue  G93.32    U09.9     3. COVID-19 long hauler manifesting chronic cough  R05.3    U09.9         Glad over time COVID long-haul symptoms are improving sgnivantly Doing well on just albuterol as needed   Plan -Continue physical therapy and strengthening exercises -Continue following healthy lifestyle - Continue albuterol as needed -> can escalate to [employer] if you end up with recurrent bronchitis - Best wishes for your husband's recovery  Followup  9 months or sooner if needed

## 2023-10-20 NOTE — Progress Notes (Signed)
 OV 11/29/2019  Subjective:  Patient ID: Mary Orr, female , DOB: February 07, 1955 , age 68 y.o. , MRN: [id number] , ADDRESS: [address] [place] [address] PCP Sophronia Ozell BROCKS, MD Patient Care Team: Sophronia Ozell BROCKS, MD as PCP - General (Family Medicine)  This Provider for this visit: Treatment Team:  Attending Provider: Geronimo Amel, MD  11/29/2019 -   Chief Complaint  Patient presents with   Consult    Post-covid, long-hauler cough, Covid las   HPI Mary Orr 68 y.o. - with a PMHx of allergies following by Dr. Fleeta Smock, COVID-19 pneumonia in October 2020 with post-COVID-19 syndrome, hypothyroidism, GERD who is here today for evaluation of chronic cough. Mary Orr states that she first contracted COVID-19 in October 23020 and was hospitalized for a short period of time. After discharge, she continued to experience significant fatigue with excessive sleepiness that lasted up till 1.5 months ago. During this time, she was sleeping approximately 10-12 hours per day. She also experienced both dry and productive cough that is worst in the morning but occurs at night, at rest and with exertion throughout the day. The cough has improved somewhat but has still persisted. It is not necessarily improved with rest or drinking sips of water. Mary Orr states that her family has mentioned she is coughing in the night-time but she does not always wake for it. In the past year, Mary Orr has also experienced persistent SOB that is worsened with exertion. For these symptoms, she has followed up with Dr. Javaid in [place]. At the time, she underwent PFTs and lab work (please see below), that was largely negative. Mary Orr states she was told her symptoms may be related to underlying sleep disorder and recommendation was for sleep studies, however she has decided not to complete these after following up with her PCP. Overall, her fatigue and GI symptoms from COVID-19 improved the  most, however her cough and SOB have persisted.   In the past 2 weeks, Mary Orr states she has also experienced a viral URI that affected her entire family. She has worsening in her cough and SOB during this time. Her PCP prescribed her Amoxicillin and Prednisone ; she completed the Amoxicillin course 4 days, however did not begin the Prednisone  pack. She has begun to feel better at this time.   She denies any previous history of lung disease other than asthma. No family history of ILD or rheumatological disorders.   PFTs (04/27/2019): Read by Dr. Javaid as normal on [facility]. Unable to obtain full results.   CTA (10/29/2018) IMPRESSION:  1.  No pulmonary embolism.  2.  No aortic aneurysm or dissection.  3.  Peripheral areas of interstitial thickening/scarring bilaterally. Multifocal groundglass opacity likely represent superimposed atypical viral pneumonia..   Echo (12/28/2018)  Normal left ventricular structure and size. Wall thickness is normal.  Systolic function is normal with an ejection fraction of 55-65%. Wall  motion is within normal limits. Unable to assess diastolic function. There  is no thrombus.  Complement C3, Serum 82 - 167 mg/dL 858        C4 Complement 12 - 38 mg/dL 19        ANA Direct Negative  Negative        RA Quant 0.0 - 13.9 IU/mL <10.0        Anti-DNA (DS) Ab Qn 0 - 9 IU/mL <1  Negative      <5                                     Equivocal  5 - 9                                     Positive      >9      Sjogren's Antibodies(SSA) 0.0 - 0.9 AI <0.2        Sjogren's Antibodies(SSB) 0.0 - 0.9 AI <0.2        RNP Antibodies 0.0 - 0.9 AI 0.4        Smith Antibodies 0.0 - 0.9 AI <0.2     Antimyeloperoxidase (MPO) Abs 0.0 - 9.0 U/mL <9.0        Antiproteinase 3 (PR-3) Abs 0.0 - 3.5 U/mL <3.5        C-ANCA Neg:<1:20 titer <1:20        P-ANCA Neg:<1:20 titer <1:20     Total IgG 586 - 1,602 mg/dL 325      IgA 87 -  647 mg/dL 884      IgM 26 - 782 mg/dL 774 High       Immunoglobulin E, Total 6 - 495 IU/mL 4 Low      Dr Felice Reflux Symptom Index (> 13-15 suggestive of LPR cough) 0 -> 5  =  none ->severe problem  Hoarseness of problem with voice 1  Clearing  Of Throat 1  Excess throat mucus or feeling of post nasal drip 3  Difficulty swallowing food, liquid or tablets 3  Cough after eating or lying down 2  Breathing difficulties or choking episodes 1  Troublesome or annoying cough 4  Sensation of something sticking in throat or lump in throat 1  Heartburn, chest pain, indigestion, or stomach acid coming up 3    xxxx    12/23/2019  - Visit   68 year old female never smoker followed in our office for chronic cough and post Covid syndrome.  She is established with Dr. Geronimo.  She was last seen on 11/29/2019.  It was recommended at that office visit that she obtain a high-resolution CT chest, obtain pulmonary function testing and continue to follow-up with ENT in [place], continue follow-up with Dr. Altamease for allergy asthma.  Patient presenting today after completing pulmonary function testing those results are listed below:  12/23/2019-pulmonary function test-FVC 2.65 (84% predicted), postbronchodilator ratio 88, postbronchodilator FEV1 2.48 (103% predicted), positive bronchodilator response in the FEV1, TLC 5.27 (107% predicted), DLCO 23.31 (120% predicted)  Per consult note on 11/29/2019 patient was diagnosed with COVID-19 pneumonia in October/2020.  She was hospitalized.  Patient reporting today she never required a mechanical ventilator.  She did receive steroids.  She also received high flow nasal cannula as initial oxygenation when hospitalized was in the low 80s.  Patient is a [employer] representative.  She is also noticed that over the last year she has had ongoing difficulty with singing.  Prior to COVID-19 infection patient was very active walking between 5 to 10 miles a day.  She  reports that she is unable to even walk closer to a mile at this point in time.  She still deals with ongoing fatigue.  She did not notice or remember a significant improvement in her symptoms when she was  taking Qvar.  She takes Qvar seasonally as managed by Dr. Altamease her allergist.  She also takes Xyzal.  She has considerable amount of postnasal drip.  She reports a cough most evenings when laying flat.  Questionaires / Pulmonary Flowsheets:   ACT:  No flowsheet data found.  MMRC: No flowsheet data found.  Epworth:  No flowsheet data found.  Tests:   12/05/2019-CT chest high-res-faint subpleural groundglass in the left lower lobe is nonspecific but may reflect subtle post COVID-19 inflammatory fibrosis, otherwise no evidence of interstitial lung disease, mild air trapping is indicative of small airways disease, marginal irregularity in the liver is indicative of cirrhosis, aortic arthrosclerosis  12/23/2019-pulmonary function test-FVC 2.65 (84% predicted), postbronchodilator ratio 88, postbronchodilator FEV1 2.48 (103% predicted), positive bronchodilator response in the FEV1, TLC 5.27 (107% predicted), DLCO 23.31 (120% predicted)    OV 02/29/2020  Subjective:  Patient ID: Mary Orr, female , DOB: 03-17-1955 , age 61 y.o. , MRN: [id number] , ADDRESS: [address] [place] [address] PCP Sophronia Ozell BROCKS, MD Patient Care Team: Sophronia Ozell BROCKS, MD as PCP - General (Family Medicine)  This Provider for this visit: Treatment Team:  Attending Provider: Geronimo Amel, MD    02/29/2020 -   Chief Complaint  Patient presents with   Follow-up    Doing better    Follow-up post COVID pneumonia October 2020 with admission  HPI Mary Orr 68 y.o. -last seen in November 2021 after that she followed up with nurse practitioner.  At that time subtle early ILD changes were noted on the CT scan of the chest.  She tells me that she has continued to improve in terms of  her cough and shortness of breath.  She has an occasional light blue color changes to her lips.  At the time she checks oxygen saturation with the finger and this is normal.  She denies similar discoloration of her hands or fingers.  Denies that her hands turn cold.  Cough and shortness of breath improving.  Symptom score is listed below.  She is concerned about the ILD changes in the CT scan of the chest and inquired about it.  Overall she feels her course is one of significant improvement.  She is relieved that she is improving.       OV 11/08/2020  Subjective:  Patient ID: Mary Orr, female , DOB: [birth year] , age 26 y.o. , MRN: [id number] , ADDRESS: [address] [place] [address] PCP Sophronia Ozell BROCKS, MD Patient Care Team: Sophronia Ozell BROCKS, MD as PCP - General (Family Medicine)  This Provider for this visit: Treatment Team:  Attending Provider: Geronimo Amel, MD    11/08/2020 -   Chief Complaint  Patient presents with   Follow-up    Pt states she has been doing better since last visit. States her cough is also better.   Follow-up post-COVID pneumonia admission October 2020   HPI Mary Orr 68 y.o. -is now 2 years since she got admitted.  She has been following with us  come 1 years this November 2022.  She tells me that overall 6 dyspnea on exertion might be some better but she is not sure.  The main issue she tells me is that 2 months prior to her COVID in October 2020 she was diagnosed with allergic asthma and given Qvar.  Then she got COVID with hospitalization in October 2020.  This required oxygen therapy.  Then she spent much of 2021  with a cough.  When she saw Redell Prescott the nurse practitioner [phone] he gave her Symbicort .  This seemed to help but in the year 2022 she stopped in the spring 2022 after she saw me in February 2022.  That she got a viral infection that required prednisone  and antibiotics.  Then she went back on Symbicort  and then stopped  it again and then by summer 2022 had another bad respiratory infection that required antibiotic and prednisone  again.  She says these episodes were severe.  Since July 2022 she has not been on any Symbicort .  No inhaled steroids.  She is asking if she should be taking Symbicort  again.  She says 2 weeks ago she saw Dr. Altamease and has been recommended to stay on Symbicort .  She says she is only realizing now that is a maintenance inhaler.  She does seem to think that during Symbicort  she was not getting respiratory infections.  She somewhat feels that the absence of Symbicort  is making her prone to respiratory infections.  She still has fixed dyspnea on exertion.  She thinks is some improved compared to a year ago but nowhere near pre-COVID.  In fact she tells me that pre-COVID she used to walk 5 miles but now she is barely able to make a mile.   She is not sure if the Symbicort  earlier in the year was actually helping her fixed dyspnea.  Current symptom score is listed below and clearly shows that she only might be marginally better compared to a year ago  In addition she is reporting episodes of exhaustion.  These happen randomly.  During this time all her symptoms get accentuated.  This characterized by worsening of the same symptoms as below..    Scymbicort $300 - too expensive. Wants alternative  PFT  OV 01/04/2021  Subjective:  Patient ID: Mary Orr, female , DOB: Oct 24, 1955 , age 61 y.o. , MRN: [id number] , ADDRESS: [address] [place] [address] PCP Sophronia Ozell BROCKS, MD Patient Care Team: Sophronia Ozell BROCKS, MD as PCP - General (Family Medicine)  This Provider for this visit: Treatment Team:  Attending Provider: Prescott Redell SQUIBB, NP    01/04/2021 -   Chief Complaint  Patient presents with   Follow-up    Went to urgent care 3 weeks ago.    Follow-up post COVID long-haul symptoms  HPI Mary Orr 68 y.o. -she has had extensive work-up.  CT scan of the chest does  show some postinflammatory scarring in the bases.  Very minimal.  No ILD.  Had cardiopulmonary exercise stress test VO2 Max adequate normal for sedentary person.  This correction on the VO2 based on ideal body weight.  This suggest obesity as the reason for dyspnea.  Pulmonary function test is normal.  Overall she is feels some better but then she again continues to report alternating feelings of being good and then having episodes where she feels like her whole body is crashing and she is significantly symptomatic.  This happens 5 or 7 times a month.  However symptoms seem the same according to history but her nadir symptoms seem better.  She states 1 week prior to this visit she had a day when she thought she was coming down with the flu after getting over a cold or bronchitis.  She feels her body was flushed and coughing she went to bed several times and missed work but then the next morning she was completely fine.  This is  how she describes the crash.    So far objective testing only shows obesity related dyspnea.  She was on Breo but she not on it currently.  Her current body weight is 170 pounds.  For a BMI of 25 her weight should be 145 pounds.  Dec 2022  01/03/21 15:40  FeNO level (ppb) 27     IMPRESSION: ct chest high res 1. Minimal, bland appearing bandlike scarring of the bilateral lung bases. No evidence of fibrotic interstitial lung disease. 2. This coarse somewhat nodular contour of the liver in the included upper abdomen, suggestive of cirrhosis. 3. Aortic atherosclerosis.   Aortic Atherosclerosis (ICD10-I70.0).     Electronically Signed   By: Marolyn JONETTA Jaksch M.D.   On: 12/11/2020 14:59    Cpex 11/15/2    Interpretation   Notes: Patient gave an excellent effort. Pulse-oximetry remained 96-98% for the duration of exercise. Exercise was performed on a cycle ergometer starting at [facility] and increasing by 10W/min.   ECG:  Resting ECG in normal sinus rhythm. HR response  appropriate. There were no sustained arrhythmias or ST-T changes. BP response appropriate.   PFT:  Pre-exercise spirometry was within normal limits. The MVV was normal. Post-exercise spirometry within normal limits.   CPX:  Exercise Capacity- Exercise testing with gas exchange demonstrates a normal peak VO2 of 15.2 ml/kg/min (85% of the age/gender/weight matched sedentary norms). The RER of 1.11 indicates a maximal effort. When adjusted to the patient's ideal body weight of 138.6 lb (62.9 kg) the peak VO2 is 18.3 ml/kg (ibw)/min (102% of the ibw-adjusted predicted).   Cardiovascular response- The O2pulse (a surrogate for stroke volume) increased with incremental exercise reaching peak at 9 ml/beat (100% predicted). DeltaVO2/Delta WR is normal at 10 Indicating No evidence of cardiovascular impairment.   Ventilatory response- The VE/VCO2 slope normal. The oxygen uptake efficiency slope (OUES) is normal. The VO2 at the ventilatory threshold was normal at 62% of the predicted peak VO2. At peak exercise, the ventilation reached 33% of the measured MVV and breathing reserve was 78 indicating ventilatory reserve remained.  PETCO2 was  normal at 36 mmHg during peak exercise.      Conclusion: Exercise testing with gas exchange demonstrates normal functional capacity when compared to matched sedentary norms. There is no indication for cardiopulmonary abnormality or exercise-induced bronchospasm. Corrections for ideal body weight suggest body habitus is contributing to exercise intolerance.    Test, report and preliminary impression by:  Josette Debby Pesa, MS, ACSM-RCEP  11/27/2020 3:50 PM    FINALIZED.  Lonna Coder MD   Pulmonary & Critical care  12/04/2020, 5:18 PM      OV 11/28/2021  Subjective:  Patient ID: Mary Orr, female , DOB: 06-Sep-1955 , age 51 y.o. , MRN: [id number] , ADDRESS: [address] [place] [address] PCP Sophronia Ozell BROCKS, MD Patient Care  Team: Sophronia Ozell BROCKS, MD as PCP - General (Family Medicine)  This Provider for this visit: Treatment Team:  Attending Provider: Geronimo Amel, MD    11/28/2021 -   Chief Complaint  Patient presents with   Follow-up    Pt states she is about the same since last visit.     HPI Mary Orr 68 y.o. -returns for follow-up of post COVID long-haul.  She tells me January 2023 she got COVID again and was treated with Paxlovid  after that she started feeling really well for 3 months.  She felt her long-haul symptoms all resolved.  She believes the viral  clearance resulted in resolution of the long-haul symptoms.  Then in March 2023 she started attending pulmonary rehabilitation.  She says she pushed herself really hard and then she developed one of her classic crash with physical exertion that she slept for 30 hours.  She missed rehab.  Slowly recovered and then went back to rehab.  Then did rehab at a lower intensity then by Easter 2023 started ramping up on rehab but then had another crash and then was fatigued and sleeping a lot.  After that again slowly improved effort tolerance at rehab and then had another crash again.  Since then she is not into rehab.  Is been few to several months since she attended rehab.  Nevertheless she feels her crash cycles are getting better.  The intensity of fatigue is less the duration of fatigue is less.  She is quite interested in repeat of empiric Paxlovid  treatment because of the theory of circulating very on particles and a previous personal experience.  We discussed some literature with metformin which is shown to be beneficial in reducing long-haul development but is again given in the immediate COVID acute illness setting.  She is not interested in this.  We took a shared decision that there is no literature to support taking Paxlovid  at this point but given the low risk profile [last creatinine check 2020] and previous tolerance with it it would be  okay.  I did a quick med review and there are no contraindicated meds    OV 09/08/2022  Subjective:  Patient ID: Mary Orr, female , DOB: April 18, 1955 , age 75 y.o. , MRN: [id number] , ADDRESS: [address] [place] [address] PCP Franchot Lauraine HERO, NP Patient Care Team: Franchot Lauraine HERO, NP as PCP - General (Nurse Practitioner)  This Provider for this visit: Treatment Team:  Attending Provider: Geronimo Amel, MD    09/08/2022 -   Chief Complaint  Patient presents with   Follow-up    6 months f/up, no cpmplaints     HPI Mary Orr 68 y.o. -presents for follow-up of COVID long-haul dyspnea.  She has unique features of COVID long-haul where she gets good days and bad days.  She calls these bad days crash days.  She states since her last visit the frequency of crash days have reduced and the intensity of these crashes have reduced.  She can get significant fatigue and also shortness of breath and cough.  Otherwise she is fine.  Recently she started singing back in the choir and she had significant improvement in effort tolerance.  She felt she was not as dyspneic as she used to be post COVID.  She also went to [place] taking her family in July 2024 and she did really well with this trip.  Nevertheless there is still crashed is happening.  She is also undergoing physical therapy for muscle strengthening exercises.  Re cough  - prior eos 500cellc/cum  - feno 09/08/2022 - 29 ppbb on breo and in grey zone  Social - Husband is getting immunotherapy for melanoma.  2 years ago he had major back surgery and fusion of 12 vertebrae.       OV 10/20/2023  Subjective:  Patient ID: Mary Orr, female , DOB: 31-Oct-1955 , age 60 y.o. , MRN: [id number] , ADDRESS: [address] [place] [address] PCP Jacques Camie Franchot, PA-C Patient Care Team: Jacques Camie Franchot DEVONNA as PCP - General (Physician Assistant)  This Provider for this  visit: Treatment Team:   Attending Provider: Geronimo Amel, MD    10/20/2023 -   Chief Complaint  Patient presents with   Medical Management of Chronic Issues    PFT not completed. Consistent SOB during exertion w/ cough. Pt states she is getting better.      HPI Mary Orr 68 y.o. -returns for follow-up.  Is been a year since I saw her.  She says that in the interim her husband felt quite ill.  He has advanced melanoma and while getting immunotherapy underwent acute liver failure.  His MELD score was 24.  His MELD score is improved to 9.  He still ECOG 3-4.  He is now awaiting a liver transplant workup at [facility].  She has been a sole caregiver and therefore she has been busy.  From a respiratory standpoint she feels well and stable.  She says the crashes that she was having because of COVID is much improved.  Still she gets short of breath with exertion and she has to pace herself.  She has not used Breo.  Her albuterol use only been 1 time in the last 1 year since earlier this year.   Last CT scan was in November 2022 with bandlike scarring but otherwise fine.  SYMPTOM SCALE - Nov 2021 02/29/2020  11/08/2020 abaseline 11/08/2020 During exhaustion episodes 01/03/21 regular day 01/03/21 during crash days 09/08/2022 Good ay 09/08/2022 Cras day  O2 use  ra        Shortness of Breath  0 -> 5 scale with 5 being worst (score 6 If unable to do)        At rest 0 0 1 2.5 0 4 0 2  Simple tasks - showers, clothes change, eating, shaving 0 0 1 2 1 4  0 3  Household (dishes, doing bed, laundry) 2 1.5 1.5 3 1 5 1 3   Shopping 1 2 1 3 1 4 1    Walking level at own pace 2 2 1.5 4 1 4 1 2   Walking up Stairs 3 3.5 1 3 2 5 2 4   Total (30-36) Dyspnea Score 8 9 7  17.5 6 26 5 14   How bad is your cough? 2 0.5 0. Bad during episodes 1  1 3   How bad is your fatigue 1 0.5 1 4 1 4 1 4   How bad is nausea  0 0 0 0 1 0 0  How bad is vomiting?   00 0 0 0 0 0 0  How bad is diarrhea?  0 0 0 0 0 0 0  How bad is anxiety?  0  0 0 0 3 0 1  How bad is depression  0 0 00 0 3 0 0    PFT     Latest Ref Rng & Units 12/23/2019    9:03 AM  PFT Results  FVC-Pre L 2.65   FVC-Predicted Pre % 84   FVC-Post L 2.83   FVC-Predicted Post % 90   Pre FEV1/FVC % % 83   Post FEV1/FCV % % 88   FEV1-Pre L 2.20   FEV1-Predicted Pre % 91   FEV1-Post L 2.48   DLCO uncorrected ml/min/mmHg 23.31   DLCO UNC% % 120   DLCO corrected ml/min/mmHg 23.31   DLCO COR %Predicted % 120   DLVA Predicted % 120   TLC L 5.27   TLC % Predicted % 107   RV % Predicted % 123        LAB  RESULTS last 96 hours No results found.       has a past medical history of Thyroid  disease.   reports that she has never smoked. She has never used smokeless tobacco.  Past Surgical History:  Procedure Laterality Date   BACK SURGERY     NASAL SINUS SURGERY      No Known Allergies  Immunization History  Administered Date(s) Administered   Influenza,inj,Quad PF,6+ Mos 10/28/2016, 10/29/2017, 12/23/2019   Influenza,trivalent, recombinat, inj, PF 12/01/2011   PFIZER(Purple Top)SARS-COV-2 Vaccination 05/03/2019, 05/24/2019, 10/17/2019   Tdap 05/31/2015    History reviewed. No pertinent family history.   Current Outpatient Medications:    albuterol (PROVENTIL) (2.5 MG/3ML) 0.083% nebulizer solution, Take by nebulization., Disp: , Rfl:    albuterol (VENTOLIN HFA) 108 (90 Base) MCG/ACT inhaler, Inhale 2 puffs into the lungs every 6 (six) hours as needed. PRN, Disp: , Rfl:    aspirin  81 MG EC tablet, Take by mouth., Disp: , Rfl:    azelastine (ASTELIN) 0.1 % nasal spray, 1 spray., Disp: , Rfl:    buPROPion (WELLBUTRIN XL) 150 MG 24 hr tablet, TAKE 1 TABLET(150 MG) BY MOUTH DAILY, Disp: , Rfl:    chlorpheniramine  (ALLER-CHLOR) 4 MG tablet, TAKE 1-2 TABLETS AT NIGHT - FOR MANAGEMENT OF ALLERGIES AND POSTNASAL DRIP AT NIGHT, Disp: 60 tablet, Rfl: 3   Diclofenac Sodium CR 100 MG 24 hr tablet, Take by mouth., Disp: , Rfl:    EPINEPHrine  0.3  mg/0.3 mL IJ SOAJ injection, Inject 0.3 mLs (0.3 mg total) into the muscle as needed for anaphylaxis., Disp: 1 each, Rfl: 0   levocetirizine (XYZAL) 5 MG tablet, Take by mouth., Disp: , Rfl:    levothyroxine (SYNTHROID) 50 MCG tablet, Take by mouth., Disp: , Rfl:    Multiple Vitamin (MULTIVITAMIN) capsule, Take 1 capsule by mouth daily., Disp: , Rfl:    pantoprazole  (PROTONIX ) 20 MG tablet, Take 1 tablet (20 mg total) by mouth 2 (two) times daily before a meal., Disp: 60 tablet, Rfl: 3   fluticasone  furoate-vilanterol (BREO ELLIPTA ) 100-25 MCG/INH AEPB, Inhale 1 puff into the lungs daily. (Patient not taking: Reported on 10/20/2023), Disp: 14 each, Rfl: 0      Objective:   Vitals:   10/20/23 0924  BP: 112/74  Pulse: 85  SpO2: 95%  Weight: 163 lb (73.9 kg)  Height: 5' 3 (1.6 m)    Estimated body mass index is 28.87 kg/m as calculated from the following:   Height as of this encounter: 5' 3 (1.6 m).   Weight as of this encounter: 163 lb (73.9 kg).  @WEIGHTCHANGE @  [employer]   10/20/23 0924  Weight: 163 lb (73.9 kg)     Physical Exam   General: No distress. Looks well O2 at rest: no Cane present: no Sitting in wheel chair: no Frail: no Obese: o Neuro: Alert and Oriented x 3. GCS 15. Speech normal Psych: Pleasant Resp:  Barrel Chest - no.  Wheeze - no, Crackles - no, No overt respiratory distress CVS: Normal heart sounds. Murmurs - no Ext: Stigmata of Connective Tissue Disease - no HEENT: Normal upper airway. PEERL +. No post nasal drip        Assessment/     Assessment & Plan COVID-19 long hauler manifesting chronic dyspnea  COVID-19 long hauler manifesting chronic fatigue  COVID-19 long hauler manifesting chronic cough    PLAN Patient Instructions     ICD-10-CM   1. COVID-19 long hauler manifesting chronic dyspnea  R06.09  U09.9     2. COVID-19 long hauler manifesting chronic fatigue  G93.32    U09.9     3. COVID-19 long hauler manifesting  chronic cough  R05.3    U09.9         Glad over time COVID long-haul symptoms are improving sgnivantly Doing well on just albuterol as needed   Plan -Continue physical therapy and strengthening exercises -Continue following healthy lifestyle - Continue albuterol as needed -> can escalate to Airsupra if you end up with recurrent bronchitis - Best wishes for your husband's recovery  Followup  9 months or sooner if needed    FOLLOWUP    Return in about 9 months (around 07/19/2024) for 1..    SIGNATURE    Dr. Dorethia Cave, M.D., F.C.C.P,  Pulmonary and Critical Care Medicine Staff Physician, [facility] Director - Interstitial Lung Disease  Program  Pulmonary Fibrosis [facility] Network at [facility] [place], [place], [address]  Pager: [phone], If no answer or between  15:00h - 7:00h: call [phone] Telephone: [phone]  10:00 AM 10/20/2023 Thank you

## 2023-10-28 ENCOUNTER — Encounter: Payer: Self-pay | Admitting: Internal Medicine

## 2023-10-28 NOTE — Telephone Encounter (Signed)
**Note De-identified  [place] Obfuscation** Please advise 

## 2023-10-29 ENCOUNTER — Ambulatory Visit: Payer: Self-pay | Admitting: Adult Health

## 2023-10-29 ENCOUNTER — Ambulatory Visit (INDEPENDENT_AMBULATORY_CARE_PROVIDER_SITE_OTHER)

## 2023-10-29 ENCOUNTER — Ambulatory Visit: Admitting: Adult Health

## 2023-10-29 ENCOUNTER — Encounter: Payer: Self-pay | Admitting: Adult Health

## 2023-10-29 ENCOUNTER — Ambulatory Visit: Payer: Self-pay | Admitting: Internal Medicine

## 2023-10-29 VITALS — BP 128/70 | HR 88 | Temp 97.8°F | Ht 63.0 in | Wt 164.8 lb

## 2023-10-29 DIAGNOSIS — J209 Acute bronchitis, unspecified: Secondary | ICD-10-CM

## 2023-10-29 DIAGNOSIS — R051 Acute cough: Secondary | ICD-10-CM

## 2023-10-29 DIAGNOSIS — J309 Allergic rhinitis, unspecified: Secondary | ICD-10-CM

## 2023-10-29 LAB — POC COVID19 BINAXNOW: SARS Coronavirus 2 Ag: NEGATIVE

## 2023-10-29 MED ORDER — ALBUTEROL SULFATE (2.5 MG/3ML) 0.083% IN NEBU: 2.5000 mg | INHALATION_SOLUTION | Freq: Four times a day (QID) | RESPIRATORY_TRACT | 3 refills | Status: AC | PRN

## 2023-10-29 MED ORDER — AZITHROMYCIN 250 MG PO TABS: ORAL_TABLET | ORAL | 0 refills | Status: AC

## 2023-10-29 MED ORDER — AIRSUPRA 90-80 MCG/ACT IN AERO: 2.0000 | INHALATION_SPRAY | Freq: Four times a day (QID) | RESPIRATORY_TRACT | 3 refills | Status: AC | PRN

## 2023-10-29 MED ORDER — PREDNISONE 10 MG PO TABS: ORAL_TABLET | ORAL | 0 refills | Status: AC

## 2023-10-29 NOTE — Telephone Encounter (Signed)
 FYI Only or Action Required?: FYI only for provider.  Patient is followed in Pulmonology for ILD, last seen on 10/20/2023 by Mary Amel, MD.  Called Nurse Triage reporting Shortness of Breath.  Symptoms began 2-3 days ago.  Interventions attempted: Rescue inhaler.  Symptoms are: gradually worsening.  Triage Disposition: See HCP Within 4 Hours (Or PCP Triage)  Patient/caregiver understands and will follow disposition?: Yes       Copied from CRM [id number]. Topic: Clinical - Red Word Triage >> Oct 29, 2023  2:25 PM Rozanna MATSU wrote: [facility] that prompted transfer to Nurse Triage: shortness of breath, heaviness in chest, coughing up mucus       Reason for Disposition  [1] Longstanding difficulty breathing (e.g., CHF, COPD, emphysema) AND [2] WORSE than normal  Answer Assessment - Initial Assessment Questions 1. RESPIRATORY STATUS: Describe your breathing? (e.g., wheezing, shortness of breath, unable to speak, severe coughing)      Shortness of breath  2. ONSET: When did this breathing problem begin?      2-3 days ago 3. PATTERN Does the difficult breathing come and go, or has it been constant since it started?      Constant, worse today  4. SEVERITY: How bad is your breathing? (e.g., mild, moderate, severe)      Mild to moderate  5. RECURRENT SYMPTOM: Have you had difficulty breathing before? If Yes, ask: When was the last time? and What happened that time?      Yes, history of ILD 6. CARDIAC HISTORY: Do you have any history of heart disease? (e.g., heart attack, angina, bypass surgery, angioplasty)      No 7. LUNG HISTORY: Do you have any history of lung disease?  (e.g., pulmonary embolus, asthma, emphysema)     ILD 8. CAUSE: What do you think is causing the breathing problem?      Believes due to allergies  9. OTHER SYMPTOMS: Do you have any other symptoms? (e.g., chest pain, cough, dizziness, fever, runny nose)     Cough 10. O2 SATURATION  MONITOR:  Do you use an oxygen saturation monitor (pulse oximeter) at home? If Yes, ask: What is your reading (oxygen level) today? What is your usual oxygen saturation reading? (e.g., 95%)       97%  Protocols used: Breathing Difficulty-A-AH
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
## 2023-10-29 NOTE — Telephone Encounter (Signed)
 Noted, seeing Tammy Parrett NP at 3:30 pm today.  Nothing further needed.

## 2023-10-29 NOTE — Patient Instructions (Addendum)
 Chest xray today.  Zpack take as directed.  Prednisone  taper over next week.  Liquid Mucinex DM Twice daily  As needed  cough/congestion  Xyzal and Astelin nasal spray As needed   Change Albuterol inhaler to Airsupra 2 puffs every 6hr as needed  Follow up with Dr. Geronimo as planned and As needed   Please contact office for sooner follow up if symptoms do not improve or worsen or seek emergency care

## 2023-10-30 NOTE — Telephone Encounter (Signed)
 Sorry getting to my messages now. Been off all week. Looks like Tammy saw you. Hope you are feeling beter
